# Patient Record
Sex: Male | Born: 2000 | Race: White | Hispanic: No | Marital: Single | Smoking: Never smoker
Health system: Southern US, Community
[De-identification: ages and names within clinical notes are randomized; demographics above are authoritative.]

## PROBLEM LIST (undated history)

## (undated) DIAGNOSIS — Z789 Other specified health status: Secondary | ICD-10-CM

## (undated) DIAGNOSIS — S62109A Fracture of unspecified carpal bone, unspecified wrist, initial encounter for closed fracture: Secondary | ICD-10-CM

## (undated) DIAGNOSIS — E119 Type 2 diabetes mellitus without complications: Secondary | ICD-10-CM

## (undated) HISTORY — PX: OTHER SURGICAL HISTORY: SHX169

## (undated) HISTORY — DX: Type 2 diabetes mellitus without complications: E11.9

---
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
## 2019-06-30 ENCOUNTER — Other Ambulatory Visit: Payer: Self-pay

## 2019-06-30 ENCOUNTER — Encounter: Payer: Self-pay | Admitting: Emergency Medicine

## 2019-06-30 ENCOUNTER — Ambulatory Visit
Admission: EM | Admit: 2019-06-30 | Discharge: 2019-06-30 | Disposition: A | Discharge status: Home / Self Care | Payer: 59 | Attending: Physician Assistant | Admitting: Physician Assistant

## 2019-06-30 DIAGNOSIS — L03113 Cellulitis of right upper limb: Secondary | ICD-10-CM | POA: Diagnosis not present

## 2019-06-30 DIAGNOSIS — B9689 Other specified bacterial agents as the cause of diseases classified elsewhere: Secondary | ICD-10-CM | POA: Diagnosis not present

## 2019-06-30 DIAGNOSIS — E109 Type 1 diabetes mellitus without complications: Secondary | ICD-10-CM | POA: Diagnosis not present

## 2019-06-30 MED ORDER — DOXYCYCLINE HYCLATE 100 MG PO CAPS: 100.0000 mg | ORAL_CAPSULE | Freq: Two times a day (BID) | ORAL | 0 refills | Status: DC

## 2019-06-30 NOTE — ED Provider Notes (Signed)
[facility]    CSN: [id number] Arrival date & time: 06/30/19  1923      History   Chief Complaint Chief Complaint  Patient presents with  . Cellulitis    HPI [name] [name]. is a 18 y.o. male.   18 year old male comes in for 3 day history of insect bite. He denies any itching. Has mild pain like a "pimple". Denies fever, chills, body aches. Does not recall anything biting him. He is not worried for tick bite as he has had many in the past and thinks he would have noticed it. He is type 1 diabetic and has well controlled glucose. Has not tried anything for the symptoms.      Past Medical History:  Diagnosis Date  . Diabetes mellitus without complication ([place])   . Fracture of wrist 2006   left  . Medical history non-contributory     Patient Active Problem List   Diagnosis Date Noted  . Hypoglycemia associated with diabetes ([place]) 02/10/2014  . Parent coping with child illness or disability 02/10/2014  . Euthyroid sick syndrome 01/28/2014  . Unintentional weight loss 01/28/2014  . Adjustment reaction 01/28/2014  . Hyperglycemia 01/25/2014  . New onset type 1 diabetes mellitus, uncontrolled ([place]) 01/25/2014    Past Surgical History:  Procedure Laterality Date  . ADENOIDECTOMY  2003  . myringotomy Bilateral 18 years of age       Home Medications    Prior to Admission medications   Medication Sig Start Date End Date Taking? Authorizing Provider  ACCU-CHEK FASTCLIX LANCETS MISC 1 each by Does not apply route 6 (six) times daily. Check sugar 6 x daily 01/28/14   [place], Katherine, MD  acetone, urine, test strip Check ketones per protocol 01/28/14   [place], Katherine, MD  doxycycline (VIBRAMYCIN) 100 MG capsule Take 1 capsule (100 mg total) by mouth 2 (two) times daily. 06/30/19   Tasia Catchings, Cortney Beissel V, PA-C  glucagon 1 MG injection Use for Severe Hypoglycemia . Inject 1 mg intramuscularly if unresponsive, unable to swallow, unconscious and/or has seizure 01/28/14    [place], Katherine, MD  glucose blood [facility] VERIO) test strip Check blood sugar 10 x daily 02/13/14   Lelon Huh, MD  glucose blood test strip Test sugar 6 times daily and per protocol for hyperglycemia and hypoglycemia 06/21/14   Lelon Huh, MD  ibuprofen (ADVIL,MOTRIN) 600 MG tablet Take 1 tablet (600 mg total) by mouth every 6 (six) hours as needed for mild pain or moderate pain. 12/10/16   Waynetta Pean, PA-C  insulin aspart (NOVOLOG) 100 UNIT/ML FlexPen Inject 0-50 Units into the skin 4 (four) times daily - after meals and at bedtime. Please use per sliding scale meal and bedtime plans 01/28/14   [place], Katherine, MD  Insulin Glargine (LANTUS) 100 UNIT/ML Solostar Pen Inject 0-50 Units into the skin daily at 10 pm. 01/28/14   [place], Katherine, MD  Insulin Pen Needle (INSUPEN PEN NEEDLES) 32G X 4 MM MISC BD Pen Needles- brand specific. Inject insulin via insulin pen 6 x daily 01/28/14   [place], Katherine, MD    Family History Family History  Problem Relation Age of Onset  . Diabetes Maternal Grandmother   . GER disease Father   . Diabetes Paternal Grandfather   . Depression Paternal Grandfather   . Alcoholism Paternal Grandfather   . Diabetes Paternal Aunt     Social History Social History   Tobacco Use  . Smoking status: Never Smoker  . Smokeless  tobacco: Never Used  Substance Use Topics  . Alcohol use: Not Currently  . Drug use: Never     Allergies   Patient has no known allergies.   Review of Systems Review of Systems  Reason unable to perform ROS: See HPI as above.     Physical Exam Triage Vital Signs ED Triage Vitals  Enc Vitals Group     BP 06/30/19 1932 122/73     Pulse Rate 06/30/19 1932 82     Resp 06/30/19 1932 18     Temp 06/30/19 1932 98.2 F (36.8 C)     Temp Source 06/30/19 1932 Oral     SpO2 06/30/19 1932 97 %     Weight --      Height --      Head Circumference --      Peak Flow --      Pain Score 06/30/19 1931 1     Pain Loc  --      Pain Edu? --      Excl. in GC? --    No data found.  Updated Vital Signs BP 122/73 (BP Location: Left Arm)   Pulse 82   Temp 98.2 F (36.8 C) (Oral)   Resp 18   SpO2 97%   Physical Exam Constitutional:      General: He is not in acute distress.    Appearance: He is well-developed. He is not diaphoretic.  HENT:     Head: Normocephalic and atraumatic.  Eyes:     Conjunctiva/sclera: Conjunctivae normal.     Pupils: Pupils are equal, round, and reactive to light.  Skin:    General: Skin is warm and dry.     Comments: Central wound with surrounding swelling consistent with insect bite. However, about 4cm surrounding erythema that is warm to touch. Tender to palpation. No induration, fluctuance.   Neurological:     Mental Status: He is alert and oriented to person, place, and time.     UC Treatments / Results  Labs (all labs ordered are listed, but only abnormal results are displayed) Labs Reviewed - No data to display  EKG   Radiology No results found.  Procedures Procedures (including critical care time)  Medications Ordered in UC Medications - No data to display  Initial Impression / Assessment and Plan / UC Course  I have reviewed the triage vital signs and the nursing notes.  Pertinent labs & imaging results that were available during my care of the patient were reviewed by me and considered in my medical decision making (see chart for details).    Patient does not know when surrounding erythema developed. However, diffusely tender to palpation. Given type 1 DM, will cover for cellulitis with doxycycline. Warm compress. Return precautions given. Patient expresses understanding and agrees to plan.  Final Clinical Impressions(s) / UC Diagnoses   Final diagnoses:  Cellulitis of right upper extremity    ED Prescriptions    Medication Sig Dispense Auth. Provider   doxycycline (VIBRAMYCIN) 100 MG capsule Take 1 capsule (100 mg total) by mouth 2 (two)  times daily. 20 capsule Threasa AlphaYu, Neftali Thurow V, PA-C        Rambo Sarafian V, [place] JerseyPA-C 06/30/19 1947

## 2019-06-30 NOTE — ED Triage Notes (Signed)
Pt presents to [facility] for assessment of raised, reddened area x 3 days on his right forearm.

## 2019-06-30 NOTE — ED Notes (Signed)
Patient able to ambulate independently  

## 2019-06-30 NOTE — Discharge Instructions (Signed)
Start doxycycline as directed. Warm compress to the area. Monitor for spreading redness, increased warmth, fever, follow up for reevaluation needed.

## 2019-11-17 LAB — HM DIABETES EYE EXAM

## 2020-02-19 LAB — HEMOGLOBIN A1C: Hemoglobin A1C: 8.1

## 2020-05-08 ENCOUNTER — Telehealth: Payer: Self-pay

## 2020-05-08 NOTE — Telephone Encounter (Signed)
   Are you willing to see [name] [name]  as a new patient? His grandfather Blima Dessert is a patient.  Please advise

## 2020-05-25 ENCOUNTER — Other Ambulatory Visit: Payer: Self-pay

## 2020-05-25 ENCOUNTER — Encounter: Payer: Self-pay | Admitting: Internal Medicine

## 2020-05-25 ENCOUNTER — Ambulatory Visit: Payer: 59 | Admitting: Internal Medicine

## 2020-05-25 VITALS — BP 132/70 | HR 73 | Temp 98.3°F | Ht 73.3 in | Wt 163.0 lb

## 2020-05-25 DIAGNOSIS — E1059 Type 1 diabetes mellitus with other circulatory complications: Secondary | ICD-10-CM

## 2020-05-25 DIAGNOSIS — E0781 Sick-euthyroid syndrome: Secondary | ICD-10-CM

## 2020-05-25 DIAGNOSIS — Z0000 Encounter for general adult medical examination without abnormal findings: Secondary | ICD-10-CM

## 2020-05-25 DIAGNOSIS — E10620 Type 1 diabetes mellitus with diabetic dermatitis: Secondary | ICD-10-CM | POA: Diagnosis not present

## 2020-05-25 DIAGNOSIS — I739 Peripheral vascular disease, unspecified: Secondary | ICD-10-CM

## 2020-05-25 DIAGNOSIS — E109 Type 1 diabetes mellitus without complications: Secondary | ICD-10-CM | POA: Insufficient documentation

## 2020-05-25 LAB — HEPATIC FUNCTION PANEL
ALT: 20 U/L (ref 0–53)
AST: 19 U/L (ref 0–37)
Albumin: 4.3 g/dL (ref 3.5–5.2)
Alkaline Phosphatase: 53 U/L (ref 52–171)
Bilirubin, Direct: 0.2 mg/dL (ref 0.0–0.3)
Total Bilirubin: 0.7 mg/dL (ref 0.2–1.2)
Total Protein: 6 g/dL (ref 6.0–8.3)

## 2020-05-25 LAB — LIPID PANEL
Cholesterol: 95 mg/dL (ref 0–200)
HDL: 39.6 mg/dL (ref >39.00)
LDL Cholesterol: 46 mg/dL (ref 0–99)
NonHDL: 55.86
Total CHOL/HDL Ratio: 2
Triglycerides: 47 mg/dL (ref 0.0–149.0)
VLDL: 9.4 mg/dL (ref 0.0–40.0)

## 2020-05-25 LAB — MICROALBUMIN / CREATININE URINE RATIO
Creatinine,U: 123.4 mg/dL
Microalb Creat Ratio: 1.4 mg/g (ref 0.0–30.0)
Microalb, Ur: 1.7 mg/dL (ref 0.0–1.9)

## 2020-05-25 LAB — URINALYSIS, ROUTINE W REFLEX MICROSCOPIC
Bilirubin Urine: NEGATIVE
Hgb urine dipstick: NEGATIVE
Ketones, ur: NEGATIVE
Leukocytes,Ua: NEGATIVE
Nitrite: NEGATIVE
Specific Gravity, Urine: 1.02 (ref 1.000–1.030)
Total Protein, Urine: NEGATIVE
Urine Glucose: >=1000 — AB
Urobilinogen, UA: 1 (ref 0.0–1.0)
pH: 7 (ref 5.0–8.0)

## 2020-05-25 LAB — CBC WITH DIFFERENTIAL/PLATELET
Basophils Absolute: 0 10*3/uL (ref 0.0–0.1)
Basophils Relative: 0.5 % (ref 0.0–3.0)
Eosinophils Absolute: 0.1 10*3/uL (ref 0.0–0.7)
Eosinophils Relative: 1 % (ref 0.0–5.0)
HCT: 41.1 % (ref 36.0–49.0)
Hemoglobin: 14.6 g/dL (ref 12.0–16.0)
Lymphocytes Relative: 31.5 % (ref 24.0–48.0)
Lymphs Abs: 1.7 10*3/uL (ref 0.7–4.0)
MCHC: 35.5 g/dL (ref 31.0–37.0)
MCV: 87.5 fl (ref 78.0–98.0)
Monocytes Absolute: 0.4 10*3/uL (ref 0.1–1.0)
Monocytes Relative: 7.6 % (ref 3.0–12.0)
Neutro Abs: 3.2 10*3/uL (ref 1.4–7.7)
Neutrophils Relative %: 59.4 % (ref 43.0–71.0)
Platelets: 220 10*3/uL (ref 150.0–575.0)
RBC: 4.69 Mil/uL (ref 3.80–5.70)
RDW: 13 % (ref 11.4–15.5)
WBC: 5.4 10*3/uL (ref 4.5–13.5)

## 2020-05-25 LAB — BASIC METABOLIC PANEL
BUN: 11 mg/dL (ref 6–23)
CO2: 31 mEq/L (ref 19–32)
Calcium: 8.7 mg/dL (ref 8.4–10.5)
Chloride: 102 mEq/L (ref 96–112)
Creatinine, Ser: 1 mg/dL (ref 0.40–1.50)
GFR: 95.73 mL/min (ref >60.00)
Glucose, Bld: 209 mg/dL — ABNORMAL HIGH (ref 70–99)
Potassium: 4.1 mEq/L (ref 3.5–5.1)
Sodium: 136 mEq/L (ref 135–145)

## 2020-05-25 LAB — POCT GLYCOSYLATED HEMOGLOBIN (HGB A1C): Hemoglobin A1C: 8.4 % — AB (ref 4.0–5.6)

## 2020-05-25 LAB — TSH: TSH: 1.35 u[IU]/mL (ref 0.40–5.00)

## 2020-05-25 NOTE — Progress Notes (Signed)
Subjective:  Patient ID: [name] [name]., male    DOB: 08-01-2001  Age: 19 y.o. MRN: [id number]  CC: Annual Exam and Diabetes  This visit occurred during the SARS-CoV-2 public health emergency.  Safety protocols were in place, including screening questions prior to the visit, additional usage of staff PPE, and extensive cleaning of exam room while observing appropriate contact time as indicated for disinfecting solutions.    HPI [name]. presents for a CPX.  He has had type 1 diabetes mellitus for about 5 years.  He is managed with a Medtronic insulin pump.  He wishes to find a new endocrinologist.  He has a rash on his lower extremities that is being treated by a dermatologist with a topical agent.  He has a history of abnormal thyroid function tests.  History [name] [name] has a past medical history of Diabetes mellitus without complication (HCC), Fracture of wrist (2006), and Medical history non-contributory.   He has a past surgical history that includes myringotomy (Bilateral, 19 years of age) and Adenoidectomy (2003).   His family history includes Alcoholism in his paternal grandfather; Depression in his paternal grandfather; Diabetes in his maternal grandmother, paternal aunt, and paternal grandfather; GER disease in his father.He reports that he has never smoked. He has never used smokeless tobacco. He reports previous alcohol use. He reports that he does not use drugs.  Outpatient Medications Prior to Visit  Medication Sig Dispense Refill  . glucose blood (CONTOUR NEXT TEST) test strip USE TO CHECK BLOOD GLUCOSE  UP TO 10 TIMES DAILY    . HUMALOG KWIKPEN 100 UNIT/ML KwikPen     . ibuprofen (ADVIL,MOTRIN) 600 MG tablet Take 1 tablet (600 mg total) by mouth every 6 (six) hours as needed for mild pain or moderate pain. 60 tablet 0  . Insulin Glargine (LANTUS) 100 UNIT/ML Solostar Pen Inject 0-50 Units into the skin daily at 10 pm. 15 mL 2  . insulin lispro (HUMALOG) 100  UNIT/ML injection Infuse via insulin pump up to 100 units    . Insulin Pen Needle (INSUPEN PEN NEEDLES) 32G X 4 MM MISC BD Pen Needles- brand specific. Inject insulin via insulin pen 6 x daily 250 each 3  . triamcinolone cream (KENALOG) 0.1 % Apply to the scaly areas on the leg.    Marland Kitchen acetone, urine, test strip Check ketones per protocol (Patient not taking: Reported on 05/25/2020) 50 each 3  . Glucagon (BAQSIMI ONE PACK) 3 MG/DOSE POWD Place into the nose.    Marland Kitchen ACCU-CHEK FASTCLIX LANCETS MISC 1 each by Does not apply route 6 (six) times daily. Check sugar 6 x daily 204 each 3  . doxycycline (VIBRAMYCIN) 100 MG capsule Take 1 capsule (100 mg total) by mouth 2 (two) times daily. 20 capsule 0  . glucagon 1 MG injection Use for Severe Hypoglycemia . Inject 1 mg intramuscularly if unresponsive, unable to swallow, unconscious and/or has seizure 2 each 3  . glucose blood (ONETOUCH VERIO) test strip Check blood sugar 10 x daily 300 each 6  . glucose blood test strip Test sugar 6 times daily and per protocol for hyperglycemia and hypoglycemia 300 each 12  . insulin aspart (NOVOLOG) 100 UNIT/ML FlexPen Inject 0-50 Units into the skin 4 (four) times daily - after meals and at bedtime. Please use per sliding scale meal and bedtime plans 15 mL 1   No facility-administered medications prior to visit.    ROS Review of Systems  Constitutional: Negative for  chills, diaphoresis, fatigue and fever.  HENT: Negative.   Eyes: Negative.   Respiratory: Negative for cough, chest tightness, shortness of breath and wheezing.   Cardiovascular: Negative for chest pain, palpitations and leg swelling.  Gastrointestinal: Negative for abdominal pain, constipation, diarrhea, nausea and vomiting.  Endocrine: Negative.  Negative for polydipsia, polyphagia and polyuria.  Genitourinary: Negative.  Negative for difficulty urinating, discharge, dysuria, scrotal swelling, testicular pain and urgency.  Musculoskeletal: Negative.   Negative for arthralgias and myalgias.  Skin: Negative.  Negative for color change and pallor.  Neurological: Negative.  Negative for dizziness, weakness and light-headedness.  Hematological: Negative for adenopathy. Does not bruise/bleed easily.  Psychiatric/Behavioral: Negative.     Objective:  BP 132/70 (BP Location: Left Arm, Patient Position: Sitting, Cuff Size: Normal)   Pulse 73   Temp 98.3 F (36.8 C) (Oral)   Ht 6' 1.3" (1.862 m)   Wt 163 lb (73.9 kg)   SpO2 97%   BMI 21.33 kg/m   Physical Exam Vitals reviewed.  Constitutional:      Appearance: Normal appearance.  HENT:     Nose: Nose normal.     Mouth/Throat:     Mouth: Mucous membranes are moist.  Eyes:     General: No scleral icterus.    Conjunctiva/sclera: Conjunctivae normal.  Cardiovascular:     Rate and Rhythm: Normal rate and regular rhythm.     Heart sounds: No murmur.  Pulmonary:     Effort: Pulmonary effort is normal.     Breath sounds: No stridor. No wheezing, rhonchi or rales.  Abdominal:     General: Abdomen is flat.     Palpations: There is no mass.     Tenderness: There is no abdominal tenderness. There is no guarding.     Hernia: No hernia is present.  Musculoskeletal:        General: Normal range of motion.     Cervical back: Neck supple.  Lymphadenopathy:     Cervical: No cervical adenopathy.  Skin:    General: Skin is warm.     Findings: Rash present.     Comments: Tan, waxy plaques scattered over BLE's.  Neurological:     General: No focal deficit present.     Mental Status: He is alert and oriented to person, place, and time. Mental status is at baseline.  Psychiatric:        Mood and Affect: Mood normal.        Behavior: Behavior normal.     Lab Results  Component Value Date   WBC 5.9 01/25/2014   HGB 12.6 01/25/2014   HCT 37.0 01/25/2014   PLT 222 01/25/2014   GLUCOSE 688 (HH) 01/25/2014   ALT 12 01/25/2014   AST 14 01/25/2014   NA 132 (L) 01/25/2014   K 3.7  01/25/2014   CL 91 (L) 01/25/2014   CREATININE 0.62 01/25/2014   BUN 11 01/25/2014   CO2 22 01/25/2014   TSH 1.487 01/25/2014   HGBA1C 8.1 02/19/2020    Assessment & Plan:   [name] [name] was seen today for annual exam and diabetes.  Diagnoses and all orders for this visit:  Euthyroid sick syndrome- His TSH is normal now and he appears clinically euthyroid. -     TSH; Future  Type 1 diabetes mellitus with other circulatory complication ([place])- His blood sugar is not quite adequately well controlled.  He will be meeting a new endocrinologist soon.  I am concerned he has PAD based on the examination  but he denies claudication.  I am concerned that he is unaware of symptoms. -     CBC with Differential/Platelet; Future -     Basic metabolic panel; Future -     Microalbumin / creatinine urine ratio; Future -     Hepatic function panel; Future -     Urinalysis, Routine w reflex microscopic; Future -     Ambulatory referral to Endocrinology -     HM Diabetes Foot Exam  Routine general medical examination at a health care facility- Exam completed, labs reviewed, he refused to get vaccinated against pneumonia and COVID-19, patient education material was given. -     Lipid panel; Future  PAD (peripheral artery disease) (HCC)- I recommended that he undergo arterial flow studies to see how severe this is. -     VAS [place] ABI WITH/WO TBI; Future   I have discontinued Ranee Gosselin. Temme Jr.'s Accu-Chek FastClix Lancets, glucagon, insulin aspart, glucose blood, glucose blood, and doxycycline. I am also having him maintain his Insulin Pen Needle, acetone (urine) test, insulin glargine, ibuprofen, triamcinolone cream, HumaLOG KwikPen, insulin lispro, Baqsimi One Pack, and Contour Next Test.  No orders of the defined types were placed in this encounter.    Follow-up: No follow-ups on file.  Sanda Linger, MD

## 2020-05-25 NOTE — Patient Instructions (Signed)

## 2020-06-27 ENCOUNTER — Other Ambulatory Visit: Payer: Self-pay

## 2020-06-27 ENCOUNTER — Ambulatory Visit (HOSPITAL_COMMUNITY)
Admission: RE | Admit: 2020-06-27 | Discharge: 2020-06-27 | Disposition: A | Discharge status: Home / Self Care | Payer: 59 | Source: Ambulatory Visit | Attending: Cardiovascular Disease | Admitting: Cardiovascular Disease

## 2020-06-27 DIAGNOSIS — I739 Peripheral vascular disease, unspecified: Secondary | ICD-10-CM | POA: Diagnosis not present

## 2020-06-28 ENCOUNTER — Encounter: Payer: Self-pay | Admitting: Internal Medicine

## 2020-06-28 ENCOUNTER — Ambulatory Visit (INDEPENDENT_AMBULATORY_CARE_PROVIDER_SITE_OTHER): Payer: 59 | Admitting: Internal Medicine

## 2020-06-28 VITALS — BP 114/80 | HR 80 | Ht 73.0 in | Wt 159.6 lb

## 2020-06-28 DIAGNOSIS — E1065 Type 1 diabetes mellitus with hyperglycemia: Secondary | ICD-10-CM

## 2020-06-28 MED ORDER — INSULIN GLARGINE 100 UNIT/ML SOLOSTAR PEN: 30.0000 [IU] | PEN_INJECTOR | Freq: Every day | SUBCUTANEOUS | 3 refills | Status: DC

## 2020-06-28 MED ORDER — INSUPEN PEN NEEDLES 32G X 4 MM MISC: 3 refills | Status: DC

## 2020-06-28 MED ORDER — HUMALOG KWIKPEN 100 UNIT/ML ~~LOC~~ SOPN: PEN_INJECTOR | SUBCUTANEOUS | 3 refills | Status: DC

## 2020-06-28 MED ORDER — BAQSIMI ONE PACK 3 MG/DOSE NA POWD: 3.0000 mg | NASAL | 0 refills | Status: DC

## 2020-06-28 MED ORDER — INSULIN PEN NEEDLE 32G X 4 MM MISC: 1.0000 | 3 refills | Status: DC

## 2020-06-28 NOTE — Patient Instructions (Signed)
-   Decrease Lantus to 30 units daily - Continue to use 1 unit of Humalog for every 12 grams of carbohydrates -Humalog correctional insulin: ADD extra units on insulin to your meal-time Humalog dose if your blood sugars are higher than 165. Use the scale below to help guide you:   Blood sugar before meal Number of units to inject  Less than 165 0 unit  166 -  200 1 units  201 -  235 2 units  236 -  270 3 units  271 -  305 4 units  306 -  340 5 units  341 - 375 6 units  376 -  410 7 units  411 -  445 8 units     HOW TO TREAT LOW BLOOD SUGARS (Blood sugar LESS THAN 70 MG/DL)  Please follow the RULE OF 15 for the treatment of hypoglycemia treatment (when your (blood sugars are less than 70 mg/dL)    STEP 1: Take 15 grams of carbohydrates when your blood sugar is low, which includes:   3-4 GLUCOSE TABS  OR  3-4 OZ OF JUICE OR REGULAR SODA OR  ONE TUBE OF GLUCOSE GEL     STEP 2: RECHECK blood sugar in 15 MINUTES STEP 3: If your blood sugar is still low at the 15 minute recheck --> then, go back to STEP 1 and treat AGAIN with another 15 grams of carbohydrates.

## 2020-06-28 NOTE — Progress Notes (Deleted)
Name: [name] [name].  MRN/ DOB: [id number], 05-Feb-2001   Age/ Sex: 19 y.o., male    PCP: Etta Grandchild, MD   Reason for Endocrinology Evaluation: Type 1 Diabetes Mellitus     Date of Initial Endocrinology Visit: 06/28/2020     PATIENT IDENTIFIER: [name] [name]. is a 19 y.o. male with a past medical history of T1DM. The patient presented for initial endocrinology clinic visit on 06/28/2020 for consultative assistance with his diabetes management.    HPI: [name] [name] was    Diagnosed with DM 2015 Prior Medications tried/Intolerance: used to be on Animas pump.  Currently checking blood sugars *** x / day,  before breakfast and ***.  Hypoglycemia episodes : ***               Symptoms: ***                 Frequency: ***/  Hemoglobin A1c has ranged from 8.1% in 2021, peaking at 8.4 in 2020. Patient required assistance for hypoglycemia:  Patient has required hospitalization within the last 1 year from hyper or hypoglycemia: no  In terms of diet, the patient ***  He has been seen by [facility] endo with the last visit 01/2020 . Basal rate has been reduced to 1.3 u/hr  Had issues with dexcom G5   HOME DIABETES REGIMEN: Humalog  Statin: {Yes/No:11203} ACE-I/ARB: {YES/NO:17245} Prior Diabetic Education: yes   METER DOWNLOAD SUMMARY: Date range evaluated: *** Fingerstick Blood Glucose Tests = *** Average Number Tests/Day = *** Overall Mean FS Glucose = *** Standard Deviation = ***  BG Ranges: Low = *** High = ***   Hypoglycemic Events/30 Days: BG < 50 = *** Episodes of symptomatic severe hypoglycemia = ***   DIABETIC COMPLICATIONS: Microvascular complications:   ***  Denies: ***  Last eye exam: Completed 10/2019  Macrovascular complications:   ***  Denies: CAD, PVD, CVA   PAST HISTORY: Past Medical History:  Past Medical History:  Diagnosis Date  . Diabetes mellitus without complication (HCC)   . Fracture of wrist 2006   left  . Medical history  non-contributory     Past Surgical History:  Past Surgical History:  Procedure Laterality Date  . ADENOIDECTOMY  2003  . myringotomy Bilateral 19 years of age      Social History:  reports that he has never smoked. He has never used smokeless tobacco. He reports previous alcohol use. He reports that he does not use drugs. Family History:  Family History  Problem Relation Age of Onset  . Diabetes Maternal Grandmother   . GER disease Father   . Diabetes Paternal Grandfather   . Depression Paternal Grandfather   . Alcoholism Paternal Grandfather   . Diabetes Paternal Aunt       HOME MEDICATIONS: Allergies as of 06/28/2020   No Known Allergies     Medication List       Accurate as of June 28, 2020  7:24 AM. If you have any questions, ask your nurse or doctor.        Baqsimi One Pack 3 MG/DOSE Powd Generic drug: Glucagon Place into the nose.   Contour Next Test test strip Generic drug: glucose blood USE TO CHECK BLOOD GLUCOSE  UP TO 10 TIMES DAILY   insulin lispro 100 UNIT/ML injection Commonly known as: HUMALOG Infuse via insulin pump up to 100 units   HumaLOG KwikPen 100 UNIT/ML KwikPen Generic drug: insulin lispro   insulin glargine 100  UNIT/ML Solostar Pen Commonly known as: LANTUS Inject 0-50 Units into the skin daily at 10 pm.   Insulin Pen Needle 32G X 4 MM Misc Commonly known as: Insupen Pen Needles BD Pen Needles- brand specific. Inject insulin via insulin pen 6 x daily   triamcinolone cream 0.1 % Commonly known as: KENALOG Apply to the scaly areas on the leg.        ALLERGIES: No Known Allergies   REVIEW OF SYSTEMS: A comprehensive ROS was conducted with the patient and is negative except as per HPI and below:  ROS    OBJECTIVE:   VITAL SIGNS: There were no vitals taken for this visit.   PHYSICAL EXAM:  General: Pt appears well and is in NAD  Hydration: Well-hydrated with moist mucous membranes and good skin turgor  HEENT: Head:  Unremarkable with good dentition. Oropharynx clear without exudate.  Eyes: External eye exam normal without stare, lid lag or exophthalmos.  EOM intact.  PERRL.  Neck: General: Supple without adenopathy or carotid bruits. Thyroid: Thyroid size normal.  No goiter or nodules appreciated. No thyroid bruit.  Lungs: Clear with good BS bilat with no rales, rhonchi, or wheezes  Heart: RRR with normal S1 and S2 and no gallops; no murmurs; no rub  Abdomen: Normoactive bowel sounds, soft, nontender, without masses or organomegaly palpable  Extremities:  Lower extremities - No pretibial edema. No lesions.  Skin: Normal texture and temperature to palpation. No rash noted. No Acanthosis nigricans/skin tags. No lipohypertrophy.  Neuro: MS is good with appropriate affect, pt is alert and Ox3    DM foot exam:    DATA REVIEWED:  Lab Results  Component Value Date   HGBA1C 8.4 (A) 05/25/2020   HGBA1C 8.1 02/19/2020   HGBA1C 6.6 06/21/2014   Lab Results  Component Value Date   MICROALBUR 1.7 05/25/2020   LDLCALC 46 05/25/2020   CREATININE 1.00 05/25/2020   Lab Results  Component Value Date   MICRALBCREAT 1.4 05/25/2020    Lab Results  Component Value Date   CHOL 95 05/25/2020   HDL 39.60 05/25/2020   LDLCALC 46 05/25/2020   TRIG 47.0 05/25/2020   CHOLHDL 2 05/25/2020        ASSESSMENT / PLAN / RECOMMENDATIONS:   1) Type 1 Diabetes Mellitus, ***controlled, Without complications - Most recent A1c of *** %. Goal A1c < 7.0 %.    Plan: GENERAL:  ***    - He is NOT interested in CGM  MEDICATIONS:  ***  EDUCATION / INSTRUCTIONS:  BG monitoring instructions: Patient is instructed to check his blood sugars *** times a day, ***.  Call [facility] if: BG persistently < 70 or > 300. . I reviewed the Rule of 15 for the treatment of hypoglycemia in detail with the patient. Literature supplied.   2) Diabetic complications:   Eye: Does *** have known diabetic  retinopathy.   Neuro/ Feet: Does *** have known diabetic peripheral neuropathy.  Renal: Patient does *** have known baseline CKD. He is *** on an ACEI/ARB at present.Check urine albumin/creatinine ratio yearly starting at time of diagnosis. If albuminuria is positive, treatment is geared toward better glucose, blood pressure control and use of ACE inhibitors or ARBs. Monitor electrolytes and creatinine once to twice yearly.   3) Lipids: Patient is *** on a statin.    4) Hypertension: ***  at goal of < 140/90 mmHg.       Signed electronically by: Lyndle Herrlich, MD  [facility]  [facility] [address] [place], [place] [address] Phone: [phone] FAX: [phone]   CC: Etta Grandchild, MD [address] [place] [address] Phone: [phone]  Fax: [phone]    Return to Endocrinology clinic as below: Future Appointments  Date Time Provider Department Center  06/28/2020  8:30 AM Bennette Hasty, Konrad Dolores, MD [facility]

## 2020-06-28 NOTE — Progress Notes (Signed)
Name: [name] [name].  MRN/ DOB: [id number], [birth year]   Age/ Sex: 19 y.o., male    PCP: Janith Lima, MD   Reason for Endocrinology Evaluation: Type 1 Diabetes Mellitus     Date of Initial Endocrinology Visit: 06/28/2020     PATIENT IDENTIFIER: [name] [name]. is a 19 y.o. male with a past medical history of T1DM. The patient presented for initial endocrinology clinic visit on 06/28/2020 for consultative assistance with his diabetes management.    HPI: Mr. Cozby  Is accompanied by his mother    Diagnosed with T1DM in 2015 Prior Medications tried/Intolerance: Was on the animas in the past, currently uses Medtronic 670 G during the seasons of fall, winter and spring , does not use it during the summer due to excessive sweating. Used to be on Dexcom G5 but did not have a good experience with it, has the guardian but does not use it.   Currently checking blood sugars 3x a day  Hypoglycemia episodes : Yes            Symptoms:yes              Frequency: 1/week Hemoglobin A1c has ranged from 6.6% in 2015, peaking at 8.4%  in . Patient required assistance for hypoglycemia:  Patient has required hospitalization within the last 1 year from hyper or hypoglycemia:   In terms of diet, the patient eats 3 meals a day and snacks. Has unpredictable pattern of exercise.    HOME DIABETES REGIMEN: Lantus 33 units daily  Humalog 1:12   Statin: no ACE-I/ARB: no   METER DOWNLOAD SUMMARY: Pt uses his insulin pump to check glucose    DIABETIC COMPLICATIONS: Microvascular complications:   Denies: retinopathy, neuropathy, CKD  Last eye exam: Completed 10/2019  Macrovascular complications:    Denies: CAD, PVD, CVA   PAST HISTORY: Past Medical History:  Past Medical History:  Diagnosis Date   Diabetes mellitus without complication ([place])    Fracture of wrist 2006   left   Medical history non-contributory     Past Surgical History:  Past Surgical History:    Procedure Laterality Date   ADENOIDECTOMY  2003   myringotomy Bilateral 19 years of age      Social History:  reports that he has never smoked. He has never used smokeless tobacco. He reports previous alcohol use. He reports that he does not use drugs. Family History:  Family History  Problem Relation Age of Onset   Diabetes Maternal Grandmother    GER disease Father    Diabetes Paternal Grandfather    Depression Paternal Grandfather    Alcoholism Paternal Grandfather    Diabetes Paternal Aunt       HOME MEDICATIONS: Allergies as of 06/28/2020   No Known Allergies     Medication List       Accurate as of June 28, 2020 11:02 AM. If you have any questions, ask your nurse or doctor.        Baqsimi One Pack 3 MG/DOSE Powd Generic drug: Glucagon Place 3 mg into the nose as directed. What changed:   how much to take  when to take this Changed by: Dorita Sciara, MD   Contour Next Link w/Device Kit by Does not apply route.   Contour Next Test test strip Generic drug: glucose blood USE TO CHECK BLOOD GLUCOSE  UP TO 10 TIMES DAILY   insulin glargine 100 UNIT/ML Solostar Pen Commonly known as: LANTUS Inject  30 Units into the skin daily at 10 pm. What changed: how much to take Changed by: Dorita Sciara, MD   insulin lispro 100 UNIT/ML injection Commonly known as: HUMALOG 40 Units. 1-40 units daily What changed: Another medication with the same name was changed. Make sure you understand how and when to take each. Changed by: Dorita Sciara, MD   HumaLOG KwikPen 100 UNIT/ML KwikPen Generic drug: insulin lispro Max daily 30 units What changed: additional instructions Changed by: Dorita Sciara, MD   Insulin Pen Needle 32G X 4 MM Misc 1 Device by Does not apply route as directed. Up to 6x daily What changed: You were already taking a medication with the same name, and this prescription was added. Make sure you understand how and  when to take each. Changed by: Dorita Sciara, MD   Insupen Pen Needles 32G X 4 MM Misc Generic drug: Insulin Pen Needle BD Pen Needles- brand specific. Inject insulin via insulin pen 6 x daily What changed: Another medication with the same name was added. Make sure you understand how and when to take each. Changed by: Dorita Sciara, MD   triamcinolone cream 0.1 % Commonly known as: KENALOG Apply to the scaly areas on the leg.        ALLERGIES: No Known Allergies   REVIEW OF SYSTEMS: A comprehensive ROS was conducted with the patient and is negative except as per HPI    OBJECTIVE:   VITAL SIGNS: BP 114/80 (BP Location: Left Arm, Patient Position: Sitting, Cuff Size: Normal)    Pulse 80    Ht '6\' 1"'$  (1.854 m)    Wt 159 lb 9.6 oz (72.4 kg)    SpO2 98%    BMI 21.06 kg/m    PHYSICAL EXAM:  General: Pt appears well and is in NAD  HEENT:  Eyes: External eye exam normal without stare, lid lag or exophthalmos.  EOM intact  Neck: General: Supple without adenopathy or carotid bruits. Thyroid: Thyroid size normal.  No goiter or nodules appreciated. No thyroid bruit.  Lungs: Clear with good BS bilat with no rales, rhonchi, or wheezes  Heart: RRR with normal S1 and S2 and no gallops; no murmurs; no rub  Abdomen: Normoactive bowel sounds, soft, nontender, without masses or organomegaly palpable  Extremities:  Lower extremities - No pretibial edema. No lesions.  Skin: Normal texture and temperature to palpation.  Neuro: MS is good with appropriate affect, pt is alert and Ox3    DM foot exam: 06/28/2020  The skin of the feet is intact without sores or ulcerations. The pedal pulses are 2+ on right and 2+ on left. The sensation is intact to a screening 5.07, 10 gram monofilament bilaterally   DATA REVIEWED:  Lab Results  Component Value Date   HGBA1C 8.4 (A) 05/25/2020   HGBA1C 8.1 02/19/2020   HGBA1C 6.6 06/21/2014   Lab Results  Component Value Date    MICROALBUR 1.7 05/25/2020   LDLCALC 46 05/25/2020   CREATININE 1.00 05/25/2020   Lab Results  Component Value Date   MICRALBCREAT 1.4 05/25/2020    Lab Results  Component Value Date   CHOL 95 05/25/2020   HDL 39.60 05/25/2020   LDLCALC 46 05/25/2020   TRIG 47.0 05/25/2020   CHOLHDL 2 05/25/2020        ASSESSMENT / PLAN / RECOMMENDATIONS:   1) Type 1 Diabetes Mellitus, Sub-Optimally controlled, Without complications - Most recent A1c of  8.4%. Goal A1c <  7.0 %.    - I have discussed with the patient the pathophysiology of diabetes. We stressed the importance of lifestyle changes including diet and exercise. I explained the complications associated with diabetes including retinopathy, nephropathy, neuropathy as well as increased risk of cardiovascular disease.   - He mainly uses the insulin pump except in the summers, so currently on Lantus/Humalog  - Pt does not count Carbs and end up guessing on his prandial dose which causes fluctuating BG from 55-300 mg.dL. He also tends to take it after the meal rather than before  -Discussed pharmacokinetics of basal/bolus insulin and the importance of taking prandial insulin with meals.  We also discussed avoiding sugar-sweetened beverages and snacks, when possible.  - Pt instructed to check BG before exercise, if < 150 mg/dL needs to eat a protein bar.  - Pt did not have a good experience with dexcom but also does not use the guardian  - He is due for an update, advised pt to notify us if he is interested in upgrading his pump   MEDICATIONS: - Decrease Lantus to 30 units daily - Continue to use 1 unit of Humalog for every 12 grams of carbohydrates - CF : Humalog ( BG-130/35)  EDUCATION / INSTRUCTIONS:  BG monitoring instructions: Patient is instructed to check his blood sugars 4 times a day, before meals and bedtime .  Call [facility] if: BG persistently < 70 or > 300.  I reviewed the Rule of 15 for the treatment  of hypoglycemia in detail with the patient. Literature supplied.   2) Diabetic complications:   Eye: Does not have known diabetic retinopathy.   Neuro/ Feet: Does not have known diabetic peripheral neuropathy.  Renal: Patient does not have known baseline CKD. He is noton an ACEI/ARB at present.Urine albumin/creatinine ratio is normal.      F/U in 3 months     I spent 45 minutes preparing to see the patient by review of recent labs, imaging and procedures, obtaining and reviewing separately obtained history, communicating with the patient/family or caregiver, ordering medications, tests or procedures, and documenting clinical information in the EHR including the differential Dx, treatment, and any further evaluation and other management   Signed electronically by: Mack Guise, MD  [facility]  [facility] [address] [place], [place] [address] Phone: [phone] FAX: [phone]   CC: Janith Lima, [place] [address] Phone: [phone]  Fax: [phone]    Return to Endocrinology clinic as below: Future Appointments  Date Time Provider [place]  10/03/2020  7:30 AM Bryce Kimble, Melanie Crazier, MD [facility]

## 2020-10-03 ENCOUNTER — Encounter: Payer: Self-pay | Admitting: Internal Medicine

## 2020-10-03 ENCOUNTER — Ambulatory Visit (INDEPENDENT_AMBULATORY_CARE_PROVIDER_SITE_OTHER): Payer: 59 | Admitting: Internal Medicine

## 2020-10-03 ENCOUNTER — Other Ambulatory Visit: Payer: Self-pay

## 2020-10-03 VITALS — BP 118/80 | HR 85 | Ht 73.0 in | Wt 159.0 lb

## 2020-10-03 DIAGNOSIS — E1065 Type 1 diabetes mellitus with hyperglycemia: Secondary | ICD-10-CM | POA: Diagnosis not present

## 2020-10-03 LAB — POCT GLYCOSYLATED HEMOGLOBIN (HGB A1C): Hemoglobin A1C: 8.2 % — AB (ref 4.0–5.6)

## 2020-10-03 MED ORDER — INSUPEN PEN NEEDLES 32G X 4 MM MISC
1.0000 | Freq: Every day | 3 refills | Status: DC
Start: 2020-10-03 — End: 2021-05-25

## 2020-10-03 NOTE — Patient Instructions (Signed)
-   Check out the INPEN Humalog     - Continue Lantus 30 units daily - Continue to use 1 unit of Humalog for every 12 grams of carbohydrates -Humalog correctional insulin: ADD extra units on insulin to your meal-time Humalog dose if your blood sugars are higher than 165. Use the scale below to help guide you:   Blood sugar before meal Number of units to inject  Less than 165 0 unit  166 -  200 1 units  201 -  235 2 units  236 -  270 3 units  271 -  305 4 units  306 -  340 5 units  341 - 375 6 units  376 -  410 7 units  411 -  445 8 units     HOW TO TREAT LOW BLOOD SUGARS (Blood sugar LESS THAN 70 MG/DL)  Please follow the RULE OF 15 for the treatment of hypoglycemia treatment (when your (blood sugars are less than 70 mg/dL)    STEP 1: Take 15 grams of carbohydrates when your blood sugar is low, which includes:   3-4 GLUCOSE TABS  OR  3-4 OZ OF JUICE OR REGULAR SODA OR  ONE TUBE OF GLUCOSE GEL     STEP 2: RECHECK blood sugar in 15 MINUTES STEP 3: If your blood sugar is still low at the 15 minute recheck --> then, go back to STEP 1 and treat AGAIN with another 15 grams of carbohydrates.

## 2020-10-03 NOTE — Progress Notes (Signed)
Name: [name] [name].  Age/ Sex: 19 y.o., male   MRN/ DOB: [id number], 03-15-2001     PCP: Janith Lima, MD   Reason for Endocrinology Evaluation: Type 1 Diabetes Mellitus  Initial Endocrine Consultative Visit: 06/28/2020    PATIENT IDENTIFIER: [name] [name]. is a 19 y.o. male with a past medical history of T1DM. The patient has followed with Endocrinology clinic since 06/28/2020 for consultative assistance with management of his diabetes.  DIABETIC HISTORY:  [name] was diagnosed with DM in 2015.Was on the animas in the past, currently uses Medtronic 670 G during the seasons of fall, winter and spring , does not use it during the summer due to excessive sweating. Used to be on Dexcom G5 but did not have a good experience with it, has the guardian but does not use it.  His hemoglobin A1c has ranged from 6.6% in 2015, peaking at 8.4%  in .  He studies advance Camera operator  SUBJECTIVE:   During the last visit (06/28/2020): A1c 8.4% we adjusted MDI regimen and provided him with  A correction scale   Today (10/03/2020): [name] is here for a follow up on diabetes management. He is accompanied by his mother.  He checks his blood sugars 2 times daily The patient has had hypoglycemic episodes since the last clinic visit, which typically occur  Fasting . The patient is symptomatic with these episodes.     HOME DIABETES REGIMEN:  Lantus 30 units daily  Humalog 1:12 - not using  CF: Humalog ( BG -130/35)     METER DOWNLOAD SUMMARY: Unable to download 71- 556 mg/dL     DIABETIC COMPLICATIONS: Microvascular complications:    Denies: CKD, neuropathy, retinopathy  Last Eye Exam: Completed 10/2019  Macrovascular complications:    Denies: CAD, CVA, PVD   HISTORY:  Past Medical History:  Past Medical History:  Diagnosis Date  . Diabetes mellitus without complication ([place])   . Fracture of wrist 2006   left  . Medical history non-contributory    Past  Surgical History:  Past Surgical History:  Procedure Laterality Date  . ADENOIDECTOMY  2003  . myringotomy Bilateral 19 years of age    Social History:  reports that he has never smoked. He has never used smokeless tobacco. He reports previous alcohol use. He reports that he does not use drugs. Family History:  Family History  Problem Relation Age of Onset  . Diabetes Maternal Grandmother   . GER disease Father   . Diabetes Paternal Grandfather   . Depression Paternal Grandfather   . Alcoholism Paternal Grandfather   . Diabetes Paternal Aunt      HOME MEDICATIONS: Allergies as of 10/03/2020   No Known Allergies     Medication List       Accurate as of October 03, 2020  8:13 AM. If you have any questions, ask your nurse or doctor.        Baqsimi One Pack 3 MG/DOSE Powd Generic drug: Glucagon Place 3 mg into the nose as directed.   Contour Next Link w/Device Kit by Does not apply route.   Contour Next Test test strip Generic drug: glucose blood USE TO CHECK BLOOD GLUCOSE  UP TO 10 TIMES DAILY   insulin glargine 100 UNIT/ML Solostar Pen Commonly known as: LANTUS Inject 30 Units into the skin daily at 10 pm.   insulin lispro 100 UNIT/ML injection Commonly known as: HUMALOG 40 Units. 1-40 units daily  HumaLOG KwikPen 100 UNIT/ML KwikPen Generic drug: insulin lispro Max daily 30 units   Insulin Pen Needle 32G X 4 MM Misc 1 Device by Does not apply route as directed. Up to 6x daily   Insupen Pen Needles 32G X 4 MM Misc Generic drug: Insulin Pen Needle BD Pen Needles- brand specific. Inject insulin via insulin pen 6 x daily   triamcinolone cream 0.1 % Commonly known as: KENALOG Apply to the scaly areas on the leg.        OBJECTIVE:   Vital Signs: BP 118/80   Pulse 85   Ht _0  (1.854 m)   Wt 159 lb (72.1 kg)   SpO2 98%   BMI 20.98 kg/m   Wt Readings from Last 3 Encounters:  10/03/20 159 lb (72.1 kg) (56 %, Z= 0.14)*  06/28/20 159 lb 9.6 oz (72.4  kg) (58 %, Z= 0.20)*  05/25/20 163 lb (73.9 kg) (63 %, Z= 0.34)*   * Growth percentiles are based on CDC (Boys, 2-20 Years) data.     Exam: General: Pt appears well and is in NAD  Neck: General: Supple without adenopathy. Thyroid: Thyroid size normal.  No goiter or nodules appreciated. No thyroid bruit.  Lungs: Clear with good BS bilat with no rales, rhonchi, or wheezes  Heart: RRR with normal S1 and S2 and no gallops; no murmurs; no rub  Abdomen: Normoactive bowel sounds, soft, nontender, without masses or organomegaly palpable  Extremities: No pretibial edema.   Neuro: MS is good with appropriate affect, pt is alert and Ox3    DM foot exam: 06/28/2020  The skin of the feet is intact without sores or ulcerations. The pedal pulses are 2+ on right and 2+ on left. The sensation is intact to a screening 5.07, 10 gram monofilament bilaterally    DATA REVIEWED:  Lab Results  Component Value Date   HGBA1C 8.2 (A) 10/03/2020   HGBA1C 8.4 (A) 05/25/2020   HGBA1C 8.1 02/19/2020   Lab Results  Component Value Date   MICROALBUR 1.7 05/25/2020   LDLCALC 46 05/25/2020   CREATININE 1.00 05/25/2020   Lab Results  Component Value Date   MICRALBCREAT 1.4 05/25/2020     Lab Results  Component Value Date   CHOL 95 05/25/2020   HDL 39.60 05/25/2020   LDLCALC 46 05/25/2020   TRIG 47.0 05/25/2020   CHOLHDL 2 05/25/2020         ASSESSMENT / PLAN / RECOMMENDATIONS:   1) Type 1 Diabetes Mellitus, Sub-optimally controlled, Without complications - Most recent A1c of 8.2  %. Goal A1c < 7.0 %.   - A1c stable  - Pt is requesting an increase on his basal insulin, upon further questioning he admits to fasting hypoglycemia, but has noted hyperglycemia after lunch. He is not counting carbs, but " knows how much prandial insulin based on what he eats"  - I have advised him to increase Humalog dose by 2 units with Lunch and go from there.  - I have encouraged him to count carbs, as this  is how we would advise him on any adjustments - Declines CGM's- not a favourable experience for him  - Uses insulin  Pump during cool weather months, as it falls with excessive sweating  - I have asked him to check the Inpen, this may be a good device for him , he will check in to it  - I have also advised him to decrease lantus ot 28 units with fasting hypoglycemia,  but at this time he would like to stay with 30 units    MEDICATIONS: - Continue Lantus 30 units daily - Continue to use 1 unit of Humalog for every 12 grams of carbohydrates - CF : Humalog ( BG-130/35)  EDUCATION / INSTRUCTIONS:  BG monitoring instructions: Patient is instructed to check his blood sugars 4 times a day, before meals and bedtime   Call [place] [facility] clinic if: BG persistently < 70 . I reviewed the Rule of 15 for the treatment of hypoglycemia in detail with the patient. Literature supplied.  2) Diabetic complications:   Eye: Does not   have known diabetic retinopathy.   Neuro/ Feet: Does not have known diabetic peripheral neuropathy .   Renal: Patient does not have known baseline CKD.     F/U in 4 months    Signed electronically by: Mack Guise, MD  [facility]  [facility] [address] [place], [place] [address] Phone: [phone] FAX: [phone]   CC: Janith Lima, MD [place] [address] Phone: [phone]  Fax: [phone]  Return to Endocrinology clinic as below: No future appointments.

## 2020-11-30 ENCOUNTER — Encounter: Payer: Self-pay | Admitting: Internal Medicine

## 2020-12-01 ENCOUNTER — Encounter: Payer: Self-pay | Admitting: Internal Medicine

## 2021-01-05 ENCOUNTER — Encounter: Payer: Self-pay | Admitting: Internal Medicine

## 2021-01-08 DIAGNOSIS — Z0279 Encounter for issue of other medical certificate: Secondary | ICD-10-CM

## 2021-01-08 NOTE — Telephone Encounter (Signed)
Called patient and notified Dr Harvel Ricks comments. Left in the front office for pick up

## 2021-01-08 NOTE — Telephone Encounter (Signed)
Please let the pt know the form is ready, there's $20 charge.  Please attach a copy of his last A1c   Thanks

## 2021-02-06 ENCOUNTER — Ambulatory Visit: Payer: 59 | Admitting: Internal Medicine

## 2021-03-27 ENCOUNTER — Ambulatory Visit: Payer: 59 | Admitting: Internal Medicine

## 2021-03-27 ENCOUNTER — Other Ambulatory Visit: Payer: Self-pay

## 2021-03-27 ENCOUNTER — Encounter: Payer: Self-pay | Admitting: Internal Medicine

## 2021-03-27 VITALS — BP 124/82 | HR 86 | Ht 73.0 in | Wt 164.2 lb

## 2021-03-27 DIAGNOSIS — E1065 Type 1 diabetes mellitus with hyperglycemia: Secondary | ICD-10-CM | POA: Diagnosis not present

## 2021-03-27 LAB — POCT GLYCOSYLATED HEMOGLOBIN (HGB A1C): Hemoglobin A1C: 8.8 % — AB (ref 4.0–5.6)

## 2021-03-27 LAB — POCT GLUCOSE (DEVICE FOR HOME USE): POC Glucose: 239 mg/dl — AB (ref 70–99)

## 2021-03-27 MED ORDER — BAQSIMI ONE PACK 3 MG/DOSE NA POWD: 3.0000 mg | NASAL | 1 refills | Status: AC

## 2021-03-27 MED ORDER — INSULIN GLARGINE 100 UNIT/ML SOLOSTAR PEN
32.0000 [IU] | PEN_INJECTOR | Freq: Every day | SUBCUTANEOUS | 3 refills | Status: DC
Start: 2021-03-27 — End: 2022-09-30

## 2021-03-27 MED ORDER — HUMALOG KWIKPEN 100 UNIT/ML ~~LOC~~ SOPN
PEN_INJECTOR | SUBCUTANEOUS | 3 refills | Status: DC
Start: 2021-03-27 — End: 2021-06-01

## 2021-03-27 MED ORDER — ONETOUCH VERIO VI STRP: ORAL_STRIP | 3 refills | Status: DC

## 2021-03-27 NOTE — Patient Instructions (Addendum)
-   Increase  Lantus to 32 units daily - Humalog 1 unit of Humalog for every 12 grams of carbohydrates -Humalog correctional insulin: ADD extra units on insulin to your meal-time Humalog dose if your blood sugars are higher than 165. Use the scale below to help guide you:   Blood sugar before meal Number of units to inject  Less than 165 0 unit  166 -  200 1 units  201 -  235 2 units  236 -  270 3 units  271 -  305 4 units  306 -  340 5 units  341 - 375 6 units  376 -  410 7 units  411 -  445 8 units     HOW TO TREAT LOW BLOOD SUGARS (Blood sugar LESS THAN 70 MG/DL)  Please follow the RULE OF 15 for the treatment of hypoglycemia treatment (when your (blood sugars are less than 70 mg/dL)    STEP 1: Take 15 grams of carbohydrates when your blood sugar is low, which includes:   3-4 GLUCOSE TABS  OR  3-4 OZ OF JUICE OR REGULAR SODA OR  ONE TUBE OF GLUCOSE GEL     STEP 2: RECHECK blood sugar in 15 MINUTES STEP 3: If your blood sugar is still low at the 15 minute recheck --> then, go back to STEP 1 and treat AGAIN with another 15 grams of carbohydrates.

## 2021-03-27 NOTE — Progress Notes (Signed)
Name: [name] [name].  Age/ Sex: 20 y.o., male   MRN/ DOB: [id number], June 13, 2001     PCP: Janith Lima, MD   Reason for Endocrinology Evaluation: Type 1 Diabetes Mellitus  Initial Endocrine Consultative Visit: 06/28/2020    PATIENT IDENTIFIER: [name]. is a 20 y.o. male with a past medical history of T1DM. The patient has followed with Endocrinology clinic since 06/28/2020 for consultative assistance with management of his diabetes.  DIABETIC HISTORY:  [name] was diagnosed with DM in 2015.Was on the animas in the past, currently uses Medtronic 670 G during the seasons of fall, winter and spring , does not use it during the summer due to excessive sweating. Used to be on Dexcom G5 but did not have a good experience with it, has the guardian but does not use it.  His hemoglobin A1c has ranged from 6.6% in 2015, peaking at 8.4%  in .  He studies advance Camera operator  SUBJECTIVE:   During the last visit (10/03/2020): A1c 8.2% we continued  MDI regimen   Today (03/27/2021): [name] is here for a follow up on diabetes management. He is accompanied by his mother.  He checks his blood sugars 2 times daily. The patient has had hypoglycemic episodes since the last clinic visit, which typically occur  Fast . The patient is symptomatic with these episodes.   Denies nausea or vomiting   This morning he ate pop tarts  219 this am   HOME DIABETES REGIMEN:  Lantus 30 units daily  Humalog 1:12 - not using  CF: Humalog ( BG -130/35)     METER DOWNLOAD SUMMARY: Unable to download 71- 556 mg/dL     DIABETIC COMPLICATIONS: Microvascular complications:    Denies: CKD, neuropathy, retinopathy  Last Eye Exam: Completed 10/2019  Macrovascular complications:    Denies: CAD, CVA, PVD   HISTORY:  Past Medical History:  Past Medical History:  Diagnosis Date  . Diabetes mellitus without complication ([place])   . Fracture of wrist 2006   left  . Medical  history non-contributory    Past Surgical History:  Past Surgical History:  Procedure Laterality Date  . ADENOIDECTOMY  2003  . myringotomy Bilateral 20 years of age    Social History:  reports that he has never smoked. He has never used smokeless tobacco. He reports previous alcohol use. He reports that he does not use drugs. Family History:  Family History  Problem Relation Age of Onset  . Diabetes Maternal Grandmother   . GER disease Father   . Diabetes Paternal Grandfather   . Depression Paternal Grandfather   . Alcoholism Paternal Grandfather   . Diabetes Paternal Aunt      HOME MEDICATIONS: Allergies as of 03/27/2021   No Known Allergies     Medication List       Accurate as of March 27, 2021  4:23 PM. If you have any questions, ask your nurse or doctor.        Baqsimi One Pack 3 MG/DOSE Powd Generic drug: Glucagon Place 3 mg into the nose as directed.   Contour Next Link w/Device Kit by Does not apply route.   insulin glargine 100 UNIT/ML Solostar Pen Commonly known as: LANTUS Inject 32 Units into the skin daily at 10 pm. What changed: how much to take Changed by: Dorita Sciara, MD   insulin lispro 100 UNIT/ML injection Commonly known as: HUMALOG 40 Units. 1-40 units daily What changed: Another  medication with the same name was changed. Make sure you understand how and when to take each. Changed by: Dorita Sciara, MD   HumaLOG KwikPen 100 UNIT/ML KwikPen Generic drug: insulin lispro Max daily 40 units What changed: additional instructions Changed by: Dorita Sciara, MD   Insulin Pen Needle 32G X 4 MM Misc 1 Device by Does not apply route as directed. Up to 6x daily   Insupen Pen Needles 32G X 4 MM Misc Generic drug: Insulin Pen Needle Inject 1 Device into the skin 6 (six) times daily. BD Pen Needles- brand specific. Inject insulin via insulin pen 6 x daily   OneTouch Verio test strip Generic drug: glucose blood 4x daily  before meals and bedtime What changed: See the new instructions. Changed by: Dorita Sciara, MD   triamcinolone 0.1 % Commonly known as: KENALOG Apply to the scaly areas on the leg.        OBJECTIVE:   Vital Signs: BP 124/82   Pulse 86   Ht $R'6\' 1"'mo$  (1.854 m)   Wt 164 lb 4 oz (74.5 kg)   SpO2 98%   BMI 21.67 kg/m   Wt Readings from Last 3 Encounters:  03/27/21 164 lb 4 oz (74.5 kg)  10/03/20 159 lb (72.1 kg) (56 %, Z= 0.14)*  06/28/20 159 lb 9.6 oz (72.4 kg) (58 %, Z= 0.20)*   * Growth percentiles are based on CDC (Boys, 2-20 Years) data.     Exam: General: Pt appears well and is in NAD  Neck: General: Supple without adenopathy. Thyroid: Thyroid size normal.  No goiter or nodules appreciated.  Lungs: Clear with good BS bilat   Heart: RRR   Abdomen: Normoactive bowel sounds, soft, nontender, without masses or organomegaly palpable  Extremities: No pretibial edema.   Neuro: MS is good with appropriate affect, pt is alert and Ox3    DM foot exam: 06/28/2020  The skin of the feet is intact without sores or ulcerations. The pedal pulses are 2+ on right and 2+ on left. The sensation is intact to a screening 5.07, 10 gram monofilament bilaterally    DATA REVIEWED:  Lab Results  Component Value Date   HGBA1C 8.8 (A) 03/27/2021   HGBA1C 8.2 (A) 10/03/2020   HGBA1C 8.4 (A) 05/25/2020   Lab Results  Component Value Date   MICROALBUR 1.7 05/25/2020   LDLCALC 46 05/25/2020   CREATININE 1.00 05/25/2020   Lab Results  Component Value Date   MICRALBCREAT 1.4 05/25/2020     Lab Results  Component Value Date   CHOL 95 05/25/2020   HDL 39.60 05/25/2020   LDLCALC 46 05/25/2020   TRIG 47.0 05/25/2020   CHOLHDL 2 05/25/2020         ASSESSMENT / PLAN / RECOMMENDATIONS:   1) Type 1 Diabetes Mellitus, Poorly  controlled, Without complications - Most recent A1c of 8.8  %. Goal A1c < 7.0 %.   - Pt with hyperglycemia due to dietary indiscretions and  medication non-adherence, he does not always use prandial insulin , and when he does he has not been counting carbs hence the hyper and hypoglycemia. He also does not use the correction scale.  - I have again  encouraged him to count carbs - Declines CGM's- not a favourable experience for him  - In the past he has told me he uses a pump during the cool months, but he has not used it for the past year. I do not recommend an upgrade ,  since he barely used his old one and he is not interested in any IQ technology.  - I have advised him to look in to the InPen in the past  - Unfortunately my role is limited here and all I can do is remind him of the importance of glycemic control in preventing microvascular complications   - He was provided with a one touch verio meter today   MEDICATIONS: - Increase  Lantus 32 units daily - Humalog I: C ratio of 1 unit for every 12 grams of carbohydrates - CF : Humalog ( BG-130/35)  EDUCATION / INSTRUCTIONS:  BG monitoring instructions: Patient is instructed to check his blood sugars 4 times a day, before meals and bedtime   Call [place] [facility] clinic if: BG persistently < 70 . I reviewed the Rule of 15 for the treatment of hypoglycemia in detail with the patient. Literature supplied.  2) Diabetic complications:   Eye: Does not   have known diabetic retinopathy.   Neuro/ Feet: Does not have known diabetic peripheral neuropathy .   Renal: Patient does not have known baseline CKD.     F/U in  6 months    Signed electronically by: Mack Guise, MD  [facility] Endocrinology  [facility] [address] [place], [place] [address] Phone: [phone] FAX: [phone]   CC: Janith Lima, MD [place] [address] Phone: [phone]  Fax: [phone]  Return to Endocrinology clinic as below: Future Appointments  Date Time Provider [place]  10/02/2021  7:50 AM Divon Krabill,  Melanie Crazier, MD [facility] [place]

## 2021-05-24 ENCOUNTER — Encounter: Payer: Self-pay | Admitting: Internal Medicine

## 2021-05-25 MED ORDER — INSUPEN PEN NEEDLES 32G X 4 MM MISC: 1.0000 | Freq: Every day | 0 refills | Status: DC

## 2021-06-01 ENCOUNTER — Other Ambulatory Visit: Payer: Self-pay | Admitting: Internal Medicine

## 2021-06-01 MED ORDER — HUMALOG KWIKPEN 200 UNIT/ML ~~LOC~~ SOPN: PEN_INJECTOR | SUBCUTANEOUS | 3 refills | Status: DC

## 2021-10-02 ENCOUNTER — Ambulatory Visit: Payer: 59 | Admitting: Internal Medicine

## 2021-11-27 ENCOUNTER — Other Ambulatory Visit: Payer: Self-pay

## 2021-11-27 ENCOUNTER — Ambulatory Visit: Payer: 59 | Admitting: Internal Medicine

## 2021-11-27 ENCOUNTER — Encounter: Payer: Self-pay | Admitting: Internal Medicine

## 2021-11-27 VITALS — BP 124/80 | HR 74 | Ht 73.0 in | Wt 167.0 lb

## 2021-11-27 DIAGNOSIS — E1065 Type 1 diabetes mellitus with hyperglycemia: Secondary | ICD-10-CM

## 2021-11-27 LAB — BASIC METABOLIC PANEL
BUN: 12 mg/dL (ref 6–23)
CO2: 28 mEq/L (ref 19–32)
Calcium: 9.1 mg/dL (ref 8.4–10.5)
Chloride: 103 mEq/L (ref 96–112)
Creatinine, Ser: 0.98 mg/dL (ref 0.40–1.50)
GFR: 110.6 mL/min (ref >60.00)
Glucose, Bld: 119 mg/dL — ABNORMAL HIGH (ref 70–99)
Potassium: 4.1 mEq/L (ref 3.5–5.1)
Sodium: 138 mEq/L (ref 135–145)

## 2021-11-27 LAB — LIPID PANEL
Cholesterol: 113 mg/dL (ref 0–200)
HDL: 47.2 mg/dL (ref >39.00)
LDL Cholesterol: 57 mg/dL (ref 0–99)
NonHDL: 66.19
Total CHOL/HDL Ratio: 2
Triglycerides: 44 mg/dL (ref 0.0–149.0)
VLDL: 8.8 mg/dL (ref 0.0–40.0)

## 2021-11-27 LAB — POCT GLYCOSYLATED HEMOGLOBIN (HGB A1C): Hemoglobin A1C: 8.2 % — AB (ref 4.0–5.6)

## 2021-11-27 LAB — MICROALBUMIN / CREATININE URINE RATIO
Creatinine,U: 203.8 mg/dL
Microalb Creat Ratio: 0.9 mg/g (ref 0.0–30.0)
Microalb, Ur: 1.8 mg/dL (ref 0.0–1.9)

## 2021-11-27 LAB — GLUCOSE, POCT (MANUAL RESULT ENTRY): POC Glucose: 142 mg/dl — AB (ref 70–99)

## 2021-11-27 LAB — T4, FREE: Free T4: 0.82 ng/dL (ref 0.60–1.60)

## 2021-11-27 LAB — TSH: TSH: 1.58 u[IU]/mL (ref 0.35–5.50)

## 2021-11-27 MED ORDER — CONTOUR NEXT TEST VI STRP: 1.0000 | ORAL_STRIP | Freq: Four times a day (QID) | 3 refills | Status: DC

## 2021-11-27 MED ORDER — CONTOUR NEXT LINK W/DEVICE KIT: 1.0000 | PACK | Freq: Four times a day (QID) | 0 refills | Status: AC

## 2021-11-27 NOTE — Progress Notes (Signed)
Name: Savas Elvin.  Age/ Sex: 20 y.o., male   MRN/ DOB: [id number], [birth year]     PCP: Janith Lima, MD   Reason for Endocrinology Evaluation: Type 1 Diabetes Mellitus  Initial Endocrine Consultative Visit: 06/28/2020    PATIENT IDENTIFIER: [name]. is a 20 y.o. male with a past medical history of T1DM. The patient has followed with Endocrinology clinic since 06/28/2020 for consultative assistance with management of his diabetes.  DIABETIC HISTORY:  Mr. Mcgeehan was diagnosed with DM in 2015.Was on the animas in the past, currently uses Medtronic 670 G during the seasons of fall, winter and spring , does not use it during the summer due to excessive sweating. Used to be on Dexcom G5 but did not have a good experience with it, has the guardian but does not use it.  His hemoglobin A1c has ranged from 6.6% in 2015, peaking at 8.4%  in .  He studies advance Camera operator  SUBJECTIVE:   During the last visit (03/27/2021): A1c 8.8% we continued  MDI regimen      Today (11/27/2021): Mr. Wiegman is here for a follow up on diabetes management. He is accompanied by his mother.  He checks his blood sugars 2 -3 times daily. The patient has had hypoglycemic episodes since the last clinic visit, which typically occur between breakfast and lunch  . The patient is symptomatic with these episodes, lowest in the 50's    Denies nausea or vomiting or diarrhea     HOME DIABETES REGIMEN:  Lantus 32 units daily - taking 36 units  Humalog 1:12 - 1:8  CF: Humalog ( BG -130/35)     METER DOWNLOAD SUMMARY: Unable to download  47 - 444m/dL     DIABETIC COMPLICATIONS: Microvascular complications:   Denies: CKD, neuropathy, retinopathy Last Eye Exam: Completed 10/2019  Macrovascular complications:   Denies: CAD, CVA, PVD   HISTORY:  Past Medical History:  Past Medical History:  Diagnosis Date   Diabetes mellitus without complication ([place]    Fracture of wrist  2006   left   Medical history non-contributory    Past Surgical History:  Past Surgical History:  Procedure Laterality Date   ADENOIDECTOMY  2003   myringotomy Bilateral 20years of age   Social History:  reports that he has never smoked. He has never used smokeless tobacco. He reports that he does not currently use alcohol. He reports that he does not use drugs. Family History:  Family History  Problem Relation Age of Onset   Diabetes Maternal Grandmother    GER disease Father    Diabetes Paternal Grandfather    Depression Paternal Grandfather    Alcoholism Paternal Grandfather    Diabetes Paternal Aunt      HOME MEDICATIONS: Allergies as of 11/27/2021   No Known Allergies      Medication List        Accurate as of November 27, 2021  7:46 AM. If you have any questions, ask your nurse or doctor.          STOP taking these medications    OneTouch Verio test strip Generic drug: glucose blood Stopped by: IDorita Sciara MD       TAKE these medications    Baqsimi One Pack 3 MG/DOSE Powd Generic drug: Glucagon Place 3 mg into the nose as directed.   Contour Next Link w/Device Kit by Does not apply route.   HumaLOG KwikPen 200 UNIT/ML KwikPen Generic  drug: insulin lispro Max daily 120 units   insulin glargine 100 UNIT/ML Solostar Pen Commonly known as: LANTUS Inject 32 Units into the skin daily at 10 pm.   Insupen Pen Needles 32G X 4 MM Misc Generic drug: Insulin Pen Needle Inject 1 Device into the skin 6 (six) times daily. BD Pen Needles- brand specific. Inject insulin via insulin pen 6 x daily   triamcinolone cream 0.1 % Commonly known as: KENALOG Apply to the scaly areas on the leg.         OBJECTIVE:   Vital Signs: BP 124/80 (BP Location: Left Arm, Patient Position: Sitting, Cuff Size: Small)   Pulse 74   Ht 6' 1" (1.854 m)   Wt 167 lb (75.8 kg)   SpO2 98%   BMI 22.03 kg/m   Wt Readings from Last 3 Encounters:  11/27/21 167 lb  (75.8 kg)  03/27/21 164 lb 4 oz (74.5 kg)  10/03/20 159 lb (72.1 kg) (56 %, Z= 0.14)*   * Growth percentiles are based on CDC (Boys, 2-20 Years) data.     Exam: General: Pt appears well and is in NAD  Neck: General: Supple without adenopathy. Thyroid: Thyroid size normal.  No goiter or nodules appreciated.  Lungs: Clear with good BS bilat   Heart: RRR   Abdomen: Normoactive bowel sounds, soft, nontender, without masses or organomegaly palpable  Extremities: No pretibial edema.   Neuro: MS is good with appropriate affect, pt is alert and Ox3     DM foot exam: 11/27/2021   The skin of the feet is intact without sores or ulcerations. The pedal pulses are 2+ on right and 2+ on left. The sensation is intact to a screening 5.07, 10 gram monofilament bilaterally     DATA REVIEWED:  Lab Results  Component Value Date   HGBA1C 8.8 (A) 03/27/2021   HGBA1C 8.2 (A) 10/03/2020   HGBA1C 8.4 (A) 05/25/2020     Latest Reference Range & Units 11/27/21 08:08  Sodium 135 - 145 mEq/L 138  Potassium 3.5 - 5.1 mEq/L 4.1  Chloride 96 - 112 mEq/L 103  CO2 19 - 32 mEq/L 28  Glucose 70 - 99 mg/dL 119 (H)  BUN 6 - 23 mg/dL 12  Creatinine 0.40 - 1.50 mg/dL 0.98  Calcium 8.4 - 10.5 mg/dL 9.1  GFR >60.00 mL/min 110.60  Total CHOL/HDL Ratio  2  Cholesterol 0 - 200 mg/dL 113  HDL Cholesterol >39.00 mg/dL 47.20  LDL (calc) 0 - 99 mg/dL 57  MICROALB/CREAT RATIO 0.0 - 30.0 mg/g 0.9  NonHDL  66.19  Triglycerides 0.0 - 149.0 mg/dL 44.0  VLDL 0.0 - 40.0 mg/dL 8.8  TSH 0.35 - 5.50 uIU/mL 1.58  T4,Free(Direct) 0.60 - 1.60 ng/dL 0.82  Creatinine,U mg/dL 203.8  Microalb, Ur 0.0 - 1.9 mg/dL 1.8    ASSESSMENT / PLAN / RECOMMENDATIONS:   1) Type 1 Diabetes Mellitus, Poorly controlled, Without complications - Most recent A1c of 8.2  %. Goal A1c < 7.0 %.   - Declines CGM's- not a favourable experience for him  - In the past he has told me he uses a pump during the cool months, but he has not used it  for the past couple of years - I have advised him to look in to the InPen in the past  - The dates on his meter are incorrect  - He has self increased basel insulin and adjusted I: C ratio, he self described hypoglycemia after breakfast, will adjust his  I:C ratio for Breakfast  - BMP, lipid , MA/Cr and TFT's are normal  - DMV form filled today   MEDICATIONS: - Continue  Lantus 36 units daily - Humalog I: C ratio of 1: 12 for Breakfast and 1:8 for Lunch and Supper  - CF : Humalog ( BG-130/35)  EDUCATION / INSTRUCTIONS: BG monitoring instructions: Patient is instructed to check his blood sugars 4 times a day, before meals and bedtime  Call [place] [facility] clinic if: BG persistently < 70 I reviewed the Rule of 15 for the treatment of hypoglycemia in detail with the patient. Literature supplied.  2) Diabetic complications:  Eye: Does not   have known diabetic retinopathy.  Neuro/ Feet: Does not have known diabetic peripheral neuropathy .  Renal: Patient does not have known baseline CKD.   3) Lipids:  - Optimal , no indication for statin therapy   F/U in  6 months    Signed electronically by: Mack Guise, MD  [facility] Endocrinology  [facility] [address], [place] [address] Phone: [phone] FAX: [phone]   CC: Janith Lima, MD [place] [address] Phone: [phone]  Fax: [phone]  Return to Endocrinology clinic as below: No future appointments.

## 2021-11-27 NOTE — Patient Instructions (Addendum)
-   Continue  Lantus 36 units daily - Humalog 1 unit of Humalog for every 12 grams of carbohydrates for Breakfast and 1:8 for lunch and Supper  -Humalog correctional insulin: ADD extra units on insulin to your meal-time Humalog dose if your blood sugars are higher than 165. Use the scale below to help guide you:   Blood sugar before meal Number of units to inject  Less than 165 0 unit  166 -  200 1 units  201 -  235 2 units  236 -  270 3 units  271 -  305 4 units  306 -  340 5 units  341 - 375 6 units  376 -  410 7 units  411 -  445 8 units    HOW TO TREAT LOW BLOOD SUGARS (Blood sugar LESS THAN 70 MG/DL) Please follow the RULE OF 15 for the treatment of hypoglycemia treatment (when your (blood sugars are less than 70 mg/dL)   STEP 1: Take 15 grams of carbohydrates when your blood sugar is low, which includes:  3-4 GLUCOSE TABS  OR 3-4 OZ OF JUICE OR REGULAR SODA OR ONE TUBE OF GLUCOSE GEL    STEP 2: RECHECK blood sugar in 15 MINUTES STEP 3: If your blood sugar is still low at the 15 minute recheck --> then, go back to STEP 1 and treat AGAIN with another 15 grams of carbohydrates.

## 2022-01-25 ENCOUNTER — Other Ambulatory Visit: Payer: Self-pay | Admitting: Internal Medicine

## 2022-03-27 ENCOUNTER — Other Ambulatory Visit: Payer: Self-pay | Admitting: Internal Medicine

## 2022-05-28 ENCOUNTER — Ambulatory Visit: Payer: 59 | Admitting: Internal Medicine

## 2022-06-19 ENCOUNTER — Encounter: Payer: Self-pay | Admitting: Internal Medicine

## 2022-06-19 ENCOUNTER — Ambulatory Visit: Payer: 59 | Admitting: Internal Medicine

## 2022-06-19 NOTE — Progress Notes (Deleted)
Name: [name] [name].  Age/ Sex: 21 y.o., male   MRN/ DOB: [id number], November 24, 2001     PCP: Janith Lima, MD   Reason for Endocrinology Evaluation: Type 1 Diabetes Mellitus  Initial Endocrine Consultative Visit: 06/28/2020    PATIENT IDENTIFIER: [name]. is a 21 y.o. male with a past medical history of T1DM. The patient has followed with Endocrinology clinic since 06/28/2020 for consultative assistance with management of his diabetes.  DIABETIC HISTORY:  Mr. Cuda was diagnosed with DM in 2015.Was on the animas in the past, currently uses Medtronic 670 G during the seasons of fall, winter and spring , does not use it during the summer due to excessive sweating. Used to be on Dexcom G5 but did not have a good experience with it, has the guardian but does not use it.  His hemoglobin A1c has ranged from 6.6% in 2015, peaking at 8.4%  in .  He studies advance agriculture technology  SUBJECTIVE:   During the last visit (11/27/2021): A1c 8.2% we continued  MDI regimen      Today (06/19/2022): [name] is here for a follow up on diabetes management. He is accompanied by his mother.  He checks his blood sugars 2 -3 times daily. The patient has had hypoglycemic episodes since the last clinic visit, which typically occur between breakfast and lunch  . The patient is symptomatic with these episodes, lowest in the 50's    Denies nausea or vomiting or diarrhea     HOME DIABETES REGIMEN:  Lantus 36 units daily  Humalog 1:12 for breakfast, 1:8 for lunch and supper CF: Humalog ( BG -130/35)     METER DOWNLOAD SUMMARY: Unable to download  31 - $Re'424mg'WCJ$ /dL     DIABETIC COMPLICATIONS: Microvascular complications:   Denies: CKD, neuropathy, retinopathy Last Eye Exam: Completed 10/2019  Macrovascular complications:   Denies: CAD, CVA, PVD   HISTORY:  Past Medical History:  Past Medical History:  Diagnosis Date   Diabetes mellitus without complication ([place])     Fracture of wrist 2006   left   Medical history non-contributory    Past Surgical History:  Past Surgical History:  Procedure Laterality Date   ADENOIDECTOMY  2003   myringotomy Bilateral 21 years of age   Social History:  reports that he has never smoked. He has never used smokeless tobacco. He reports that he does not currently use alcohol. He reports that he does not use drugs. Family History:  Family History  Problem Relation Age of Onset   Diabetes Maternal Grandmother    GER disease Father    Diabetes Paternal Grandfather    Depression Paternal Grandfather    Alcoholism Paternal Grandfather    Diabetes Paternal Aunt      HOME MEDICATIONS: Allergies as of 06/19/2022   No Known Allergies      Medication List        Accurate as of June 19, 2022  7:08 AM. If you have any questions, ask your nurse or doctor.          Baqsimi One Pack 3 MG/DOSE Powd Generic drug: Glucagon Place 3 mg into the nose as directed.   BD Pen Needle Nano U/F 32G X 4 MM Misc Generic drug: Insulin Pen Needle INJECT INSULIN VIA INSULIN PEN 6 TIMES DAILY   Contour Next Link w/Device Kit 1 Device by Does not apply route in the morning, at noon, in the evening, and at bedtime.   Contour  Next Test test strip Generic drug: glucose blood 1 each by Other route in the morning, at noon, in the evening, and at bedtime. Use as instructed   HumaLOG KwikPen 200 UNIT/ML KwikPen Generic drug: insulin lispro Max daily 120 units   insulin glargine 100 UNIT/ML Solostar Pen Commonly known as: LANTUS Inject 32 Units into the skin daily at 10 pm.   triamcinolone cream 0.1 % Commonly known as: KENALOG Apply to the scaly areas on the leg.         OBJECTIVE:   Vital Signs: There were no vitals taken for this visit.  Wt Readings from Last 3 Encounters:  11/27/21 167 lb (75.8 kg)  03/27/21 164 lb 4 oz (74.5 kg)  10/03/20 159 lb (72.1 kg) (56 %, Z= 0.14)*   * Growth percentiles are based on CDC  (Boys, 2-20 Years) data.     Exam: General: Pt appears well and is in NAD  Neck: General: Supple without adenopathy. Thyroid: Thyroid size normal.  No goiter or nodules appreciated.  Lungs: Clear with good BS bilat   Heart: RRR   Abdomen: Normoactive bowel sounds, soft, nontender, without masses or organomegaly palpable  Extremities: No pretibial edema.   Neuro: MS is good with appropriate affect, pt is alert and Ox3     DM foot exam: 11/27/2021   The skin of the feet is intact without sores or ulcerations. The pedal pulses are 2+ on right and 2+ on left. The sensation is intact to a screening 5.07, 10 gram monofilament bilaterally     DATA REVIEWED:  Lab Results  Component Value Date   HGBA1C 8.2 (A) 11/27/2021   HGBA1C 8.8 (A) 03/27/2021   HGBA1C 8.2 (A) 10/03/2020     Latest Reference Range & Units 11/27/21 08:08  Sodium 135 - 145 mEq/L 138  Potassium 3.5 - 5.1 mEq/L 4.1  Chloride 96 - 112 mEq/L 103  CO2 19 - 32 mEq/L 28  Glucose 70 - 99 mg/dL 119 (H)  BUN 6 - 23 mg/dL 12  Creatinine 0.40 - 1.50 mg/dL 0.98  Calcium 8.4 - 10.5 mg/dL 9.1  GFR >60.00 mL/min 110.60  Total CHOL/HDL Ratio  2  Cholesterol 0 - 200 mg/dL 113  HDL Cholesterol >39.00 mg/dL 47.20  LDL (calc) 0 - 99 mg/dL 57  MICROALB/CREAT RATIO 0.0 - 30.0 mg/g 0.9  NonHDL  66.19  Triglycerides 0.0 - 149.0 mg/dL 44.0  VLDL 0.0 - 40.0 mg/dL 8.8  TSH 0.35 - 5.50 uIU/mL 1.58  T4,Free(Direct) 0.60 - 1.60 ng/dL 0.82  Creatinine,U mg/dL 203.8  Microalb, Ur 0.0 - 1.9 mg/dL 1.8    ASSESSMENT / PLAN / RECOMMENDATIONS:   1) Type 1 Diabetes Mellitus, Poorly controlled, Without complications - Most recent A1c of 8.2  %. Goal A1c < 7.0 %.   - Declines CGM's- not a favourable experience for him  - In the past he has told me he uses a pump during the cool months, but he has not used it for the past couple of years - I have advised him to look in to the InPen in the past  - The dates on his meter are incorrect   - He has self increased basel insulin and adjusted I: C ratio, he self described hypoglycemia after breakfast, will adjust his I:C ratio for Breakfast  - BMP, lipid , MA/Cr and TFT's are normal  - DMV form filled today   MEDICATIONS: - Continue  Lantus 36 units daily - Humalog I: C ratio  of 1: 12 for Breakfast and 1:8 for Lunch and Supper  - CF : Humalog ( BG-130/35)  EDUCATION / INSTRUCTIONS: BG monitoring instructions: Patient is instructed to check his blood sugars 4 times a day, before meals and bedtime  Call [facility] if: BG persistently < 70 I reviewed the Rule of 15 for the treatment of hypoglycemia in detail with the patient. Literature supplied.  2) Diabetic complications:  Eye: Does not   have known diabetic retinopathy.  Neuro/ Feet: Does not have known diabetic peripheral neuropathy .  Renal: Patient does not have known baseline CKD.   3) Lipids:  - Optimal , no indication for statin therapy   F/U in  6 months    Signed electronically by: Mack Guise, MD  [facility] End[facility]  [facility] [address] [place], [place] [address] Phone: [phone] FAX: [phone]   CC: Janith Lima, MD [place] [address] Phone: [phone]  Fax: [phone]  Return to Endocrinology clinic as below: Future Appointments  Date Time Provider [place]  06/19/2022  7:30 AM Jarika Robben, Melanie Crazier, MD [id number] None

## 2022-09-30 ENCOUNTER — Ambulatory Visit (INDEPENDENT_AMBULATORY_CARE_PROVIDER_SITE_OTHER): Payer: 59 | Admitting: Internal Medicine

## 2022-09-30 ENCOUNTER — Encounter: Payer: Self-pay | Admitting: Internal Medicine

## 2022-09-30 VITALS — BP 118/76 | HR 84 | Ht 73.0 in | Wt 170.4 lb

## 2022-09-30 DIAGNOSIS — E1065 Type 1 diabetes mellitus with hyperglycemia: Secondary | ICD-10-CM | POA: Insufficient documentation

## 2022-09-30 LAB — POCT GLUCOSE (DEVICE FOR HOME USE): POC Glucose: 170 mg/dl — AB (ref 70–99)

## 2022-09-30 LAB — POCT GLYCOSYLATED HEMOGLOBIN (HGB A1C): Hemoglobin A1C: 7.9 % — AB (ref 4.0–5.6)

## 2022-09-30 MED ORDER — INSULIN PEN NEEDLE 32G X 4 MM MISC: 1.0000 | Freq: Four times a day (QID) | 3 refills | Status: AC

## 2022-09-30 MED ORDER — ONETOUCH VERIO VI STRP: 1.0000 | ORAL_STRIP | Freq: Four times a day (QID) | 3 refills | Status: AC

## 2022-09-30 MED ORDER — DEXCOM G7 SENSOR MISC: 1.0000 | 3 refills | Status: AC

## 2022-09-30 MED ORDER — INSULIN GLARGINE 100 UNIT/ML SOLOSTAR PEN: 34.0000 [IU] | PEN_INJECTOR | Freq: Every day | SUBCUTANEOUS | 3 refills | Status: AC

## 2022-09-30 MED ORDER — HUMALOG KWIKPEN 200 UNIT/ML ~~LOC~~ SOPN: PEN_INJECTOR | SUBCUTANEOUS | 3 refills | Status: AC

## 2022-09-30 NOTE — Patient Instructions (Signed)
-   Decrease   Lantus 34 units daily - Humalog 1 unit of Humalog for every 12 grams of carbohydrates for Breakfast and 1:8 for lunch and Supper  -Humalog correctional insulin: ADD extra units on insulin to your meal-time Humalog dose if your blood sugars are higher than 165. Use the scale below to help guide you:   Blood sugar before meal Number of units to inject  Less than 165 0 unit  166 -  200 1 units  201 -  235 2 units  236 -  270 3 units  271 -  305 4 units  306 -  340 5 units  341 - 375 6 units  376 -  410 7 units  411 -  445 8 units    HOW TO TREAT LOW BLOOD SUGARS (Blood sugar LESS THAN 70 MG/DL) Please follow the RULE OF 15 for the treatment of hypoglycemia treatment (when your (blood sugars are less than 70 mg/dL)   STEP 1: Take 15 grams of carbohydrates when your blood sugar is low, which includes:  3-4 GLUCOSE TABS  OR 3-4 OZ OF JUICE OR REGULAR SODA OR ONE TUBE OF GLUCOSE GEL    STEP 2: RECHECK blood sugar in 15 MINUTES STEP 3: If your blood sugar is still low at the 15 minute recheck --> then, go back to STEP 1 and treat AGAIN with another 15 grams of carbohydrates.

## 2022-09-30 NOTE — Progress Notes (Signed)
Name: [name] [name].  Age/ Sex: 21 y.o., male   MRN/ DOB: [id number], 10-29-2001     PCP: Janith Lima, MD   Reason for Endocrinology Evaluation: Type 1 Diabetes Mellitus  Initial Endocrine Consultative Visit: 06/28/2020    PATIENT IDENTIFIER: [name]. is a 21 y.o. male with a past medical history of T1DM. The patient has followed with Endocrinology clinic since 06/28/2020 for consultative assistance with management of his diabetes.  DIABETIC HISTORY:  Mr. Clute was diagnosed with DM in 2015.Was on the animas in the past, currently uses Medtronic 670 G during the seasons of fall, winter and spring , does not use it during the summer due to excessive sweating. Used to be on Dexcom G5 but did not have a good experience with it, has the guardian but does not use it.  His hemoglobin A1c has ranged from 6.6% in 2015, peaking at 8.4%  in .  He studies advance agriculture technology  SUBJECTIVE:   During the last visit (11/27/2021): A1c 8.2%     Today (09/30/2022): [name] is here for a follow up on diabetes management. He checks his blood sugars 4 times daily. The patient has had hypoglycemic episodes since the last clinic visit, which typically occur between breakfast and lunch  . The patient is symptomatic with these episodes, lowest in the 50's    NO recent  nausea or vomiting or diarrhea except one episode a couple of weeks ago Denies tingling and numbness of the feet     HOME DIABETES REGIMEN:  Lantus 36 units daily  Humalog 1:12 with Breakfast, 1:8 for luch and Supper  CF: Humalog ( BG -130/35)    METER DOWNLOAD SUMMARY:   12-244   DIABETIC COMPLICATIONS: Microvascular complications:   Denies: CKD, neuropathy, retinopathy Last Eye Exam: Completed 2022  Macrovascular complications:   Denies: CAD, CVA, PVD   HISTORY:  Past Medical History:  Past Medical History:  Diagnosis Date   Diabetes mellitus without complication ([place])    Fracture of  wrist 2006   left   Medical history non-contributory    Past Surgical History:  Past Surgical History:  Procedure Laterality Date   ADENOIDECTOMY  2003   myringotomy Bilateral 21 years of age   Social History:  reports that he has never smoked. He has never used smokeless tobacco. He reports that he does not currently use alcohol. He reports that he does not use drugs. Family History:  Family History  Problem Relation Age of Onset   Diabetes Maternal Grandmother    GER disease Father    Diabetes Paternal Grandfather    Depression Paternal Grandfather    Alcoholism Paternal Grandfather    Diabetes Paternal Aunt      HOME MEDICATIONS: Allergies as of 09/30/2022   No Known Allergies      Medication List        Accurate as of September 30, 2022  3:08 PM. If you have any questions, ask your nurse or doctor.          Baqsimi One Pack 3 MG/DOSE Powd Generic drug: Glucagon Place 3 mg into the nose as directed.   BD Pen Needle Nano U/F 32G X 4 MM Misc Generic drug: Insulin Pen Needle INJECT INSULIN VIA INSULIN PEN 6 TIMES DAILY   Contour Next Link w/Device Kit 1 Device by Does not apply route in the morning, at noon, in the evening, and at bedtime.   Contour Next Test test  strip Generic drug: glucose blood 1 each by Other route in the morning, at noon, in the evening, and at bedtime. Use as instructed   HumaLOG KwikPen 200 UNIT/ML KwikPen Generic drug: insulin lispro Max daily 120 units   insulin glargine 100 UNIT/ML Solostar Pen Commonly known as: LANTUS Inject 32 Units into the skin daily at 10 pm. What changed: how much to take   triamcinolone cream 0.1 % Commonly known as: KENALOG Apply to the scaly areas on the leg.         OBJECTIVE:   Vital Signs: BP 118/76 (BP Location: Left Arm, Patient Position: Sitting, Cuff Size: Small)   Pulse 84   Ht _0  (1.854 m)   Wt 170 lb 6.4 oz (77.3 kg)   SpO2 99%   BMI 22.48 kg/m   Wt Readings from Last 3  Encounters:  09/30/22 170 lb 6.4 oz (77.3 kg)  11/27/21 167 lb (75.8 kg)  03/27/21 164 lb 4 oz (74.5 kg)     Exam: General: Pt appears well and is in NAD  Neck: General: Supple without adenopathy. Thyroid: Thyroid size normal.  No goiter or nodules appreciated.  Lungs: Clear with good BS bilat   Heart: RRR   Abdomen: Normoactive bowel sounds, soft, nontender, without masses or organomegaly palpable  Extremities: No pretibial edema.   Neuro: MS is good with appropriate affect, pt is alert and Ox3     DM foot exam: 09/30/2022   The skin of the feet is intact without sores or ulcerations. The pedal pulses are 2+ on right and 2+ on left. The sensation is intact to a screening 5.07, 10 gram monofilament bilaterally     DATA REVIEWED:  Lab Results  Component Value Date   HGBA1C 7.9 (A) 09/30/2022   HGBA1C 8.2 (A) 11/27/2021   HGBA1C 8.8 (A) 03/27/2021     Latest Reference Range & Units 11/27/21 08:08  Sodium 135 - 145 mEq/L 138  Potassium 3.5 - 5.1 mEq/L 4.1  Chloride 96 - 112 mEq/L 103  CO2 19 - 32 mEq/L 28  Glucose 70 - 99 mg/dL 119 (H)  BUN 6 - 23 mg/dL 12  Creatinine 0.40 - 1.50 mg/dL 0.98  Calcium 8.4 - 10.5 mg/dL 9.1  GFR >60.00 mL/min 110.60  Total CHOL/HDL Ratio  2  Cholesterol 0 - 200 mg/dL 113  HDL Cholesterol >39.00 mg/dL 47.20  LDL (calc) 0 - 99 mg/dL 57  MICROALB/CREAT RATIO 0.0 - 30.0 mg/g 0.9  NonHDL  66.19  Triglycerides 0.0 - 149.0 mg/dL 44.0  VLDL 0.0 - 40.0 mg/dL 8.8  TSH 0.35 - 5.50 uIU/mL 1.58  T4,Free(Direct) 0.60 - 1.60 ng/dL 0.82  Creatinine,U mg/dL 203.8  Microalb, Ur 0.0 - 1.9 mg/dL 1.8    ASSESSMENT / PLAN / RECOMMENDATIONS:   1) Type 1 Diabetes Mellitus, Sub-optimally  controlled, Without complications - Most recent A1c of 7.9  %. Goal A1c < 7.0 %.   - I am unable to verify his glucose readings on the Medtronic as its dated 2012, he was provided with dexcom G7 sample, a prescription was sent  to his mail pharmacy , he was also  provided with onetouch verio  meter and was asked not to use his old Medtronic meter  -Patient tells me he has been using less prandial insulin for supper due to tight/low BG's in the morning, I have advised the patient to reduce Lantus as below -I would not be making any changes to his correction scale number insulin  to carb ratio due to lack of glucose data -DMV form has been filled today    MEDICATIONS: -Decrease Lantus 34 units daily -Continue Humalog I: C ratio of 1: 12 for Breakfast and 1:8 for Lunch and Supper  -Continue CF : Humalog ( BG-130/35)  EDUCATION / INSTRUCTIONS: BG monitoring instructions: Patient is instructed to check his blood sugars 4 times a day, before meals and bedtime  Call [facility] clinic if: BG persistently < 70 I reviewed the Rule of 15 for the treatment of hypoglycemia in detail with the patient. Literature supplied.  2) Diabetic complications:  Eye: Does not   have known diabetic retinopathy.  Neuro/ Feet: Does not have known diabetic peripheral neuropathy .  Renal: Patient does not have known baseline CKD.     F/U in  6 months    Signed electronically by: Mack Guise, MD  [facility]  [facility] [address] [place], [place] [address] Phone: [phone] FAX: [phone]   CC: Janith Lima, MD [place] [address] Phone: [phone]  Fax: [phone]  Return to Endocrinology clinic as below: No future appointments.

## 2022-10-02 ENCOUNTER — Other Ambulatory Visit (HOSPITAL_COMMUNITY): Payer: Self-pay

## 2022-10-02 ENCOUNTER — Telehealth: Payer: Self-pay

## 2022-10-02 NOTE — Telephone Encounter (Signed)
Patient Advocate Encounter   Received notification that prior authorization for Dexcom G7 Sensor is required/requested.   PA submitted on 10/02/22 to [employer] via CoverMyMeds Key [id number]  Status is pending

## 2022-10-02 NOTE — Telephone Encounter (Signed)
Patient Advocate Encounter  Prior Authorization for [employer] has been approved.    PA# [id number] Key: [id number] Effective dates: 10/02/22 through 10/03/23

## 2023-04-03 ENCOUNTER — Ambulatory Visit: Payer: 59 | Admitting: Internal Medicine

## 2023-04-03 ENCOUNTER — Encounter: Payer: Self-pay | Admitting: Internal Medicine

## 2023-04-03 ENCOUNTER — Telehealth: Payer: Self-pay | Admitting: Internal Medicine

## 2023-04-03 NOTE — Telephone Encounter (Signed)
Patient dismissed from [facility] and all providers practicing at this clinic will no longer be able to provide medical care to you due to frequent missed appoints. 04/03/23

## 2023-04-03 NOTE — Progress Notes (Deleted)
Name: [name] [name].  Age/ Sex: 22 y.o., male   MRN/ DOB: [id number], [birth year]     PCP: Janith Lima, MD   Reason for Endocrinology Evaluation: Type 1 Diabetes Mellitus  Initial Endocrine Consultative Visit: 06/28/2020    PATIENT IDENTIFIER: [name] [name]. is a 22 y.o. male with a past medical history of T1DM. The patient has followed with Endocrinology clinic since 06/28/2020 for consultative assistance with management of his diabetes.  DIABETIC HISTORY:  [name] [name] was diagnosed with DM in 2015.Was on the animas in the past, currently uses Medtronic 670 G during the seasons of fall, winter and spring , does not use it during the summer due to excessive sweating. Used to be on Dexcom G5 but did not have a good experience with it, has the guardian but does not use it.  His hemoglobin A1c has ranged from 6.6% in 2015, peaking at 8.4%  in .  He studies advance agriculture technology  SUBJECTIVE:   During the last visit (10/31/2022): A1c 7.9%     Today (04/03/2023): [name] [name] is here for a follow up on diabetes management. He checks his blood sugars 4 times daily. The patient has had hypoglycemic episodes since the last clinic visit, which typically occur between breakfast and lunch  . The patient is symptomatic with these episodes.   No recent  nausea or vomiting or diarrhea Denies tingling and numbness of the feet     HOME DIABETES REGIMEN:  Lantus 34 units daily  Humalog 1:12 with Breakfast, 1:8 for lunch and Supper  CF: Humalog ( BG -130/35)    METER DOWNLOAD SUMMARY:   69-439 mg/dL    DIABETIC COMPLICATIONS: Microvascular complications:   Denies: CKD, neuropathy, retinopathy Last Eye Exam: Completed 2022  Macrovascular complications:   Denies: CAD, CVA, PVD   HISTORY:  Past Medical History:  Past Medical History:  Diagnosis Date   Diabetes mellitus without complication ([place])    Fracture of wrist 2006   left   Medical history non-contributory     Past Surgical History:  Past Surgical History:  Procedure Laterality Date   ADENOIDECTOMY  2003   myringotomy Bilateral 22 years of age   Social History:  reports that he has never smoked. He has never used smokeless tobacco. He reports that he does not currently use alcohol. He reports that he does not use drugs. Family History:  Family History  Problem Relation Age of Onset   Diabetes Maternal Grandmother    GER disease Father    Diabetes Paternal Grandfather    Depression Paternal Grandfather    Alcoholism Paternal Grandfather    Diabetes Paternal Aunt      HOME MEDICATIONS: Allergies as of 04/03/2023   No Known Allergies      Medication List        Accurate as of April 03, 2023  6:56 AM. If you have any questions, ask your nurse or doctor.          Baqsimi One Pack 3 MG/DOSE Powd Generic drug: Glucagon Place 3 mg into the nose as directed.   Contour Next Link w/Device Kit 1 Device by Does not apply route in the morning, at noon, in the evening, and at bedtime.   Dexcom G7 Sensor Misc 1 Device by Does not apply route as directed.   HumaLOG KwikPen 200 UNIT/ML KwikPen Generic drug: insulin lispro Max daily 120 units   insulin glargine 100 UNIT/ML Solostar Pen Commonly known as: LANTUS  Inject 34 Units into the skin daily.   Insulin Pen Needle 32G X 4 MM Misc 1 Device by Does not apply route in the morning, at noon, in the evening, and at bedtime.   OneTouch Verio test strip Generic drug: glucose blood 1 each by Other route in the morning, at noon, in the evening, and at bedtime. Use as instructed   triamcinolone cream 0.1 % Commonly known as: KENALOG Apply to the scaly areas on the leg.         OBJECTIVE:   Vital Signs: There were no vitals taken for this visit.  Wt Readings from Last 3 Encounters:  09/30/22 170 lb 6.4 oz (77.3 kg)  11/27/21 167 lb (75.8 kg)  03/27/21 164 lb 4 oz (74.5 kg)     Exam: General: Pt appears well and is in  NAD  Neck: General: Supple without adenopathy. Thyroid: Thyroid size normal.  No goiter or nodules appreciated.  Lungs: Clear with good BS bilat   Heart: RRR   Abdomen: Normoactive bowel sounds, soft, nontender, without masses or organomegaly palpable  Extremities: No pretibial edema.   Neuro: MS is good with appropriate affect, pt is alert and Ox3     DM foot exam: 09/30/2022   The skin of the feet is intact without sores or ulcerations. The pedal pulses are 2+ on right and 2+ on left. The sensation is intact to a screening 5.07, 10 gram monofilament bilaterally     DATA REVIEWED:  Lab Results  Component Value Date   HGBA1C 7.9 (A) 09/30/2022   HGBA1C 8.2 (A) 11/27/2021   HGBA1C 8.8 (A) 03/27/2021     Latest Reference Range & Units 11/27/21 08:08  Sodium 135 - 145 mEq/L 138  Potassium 3.5 - 5.1 mEq/L 4.1  Chloride 96 - 112 mEq/L 103  CO2 19 - 32 mEq/L 28  Glucose 70 - 99 mg/dL 119 (H)  BUN 6 - 23 mg/dL 12  Creatinine 0.40 - 1.50 mg/dL 0.98  Calcium 8.4 - 10.5 mg/dL 9.1  GFR >60.00 mL/min 110.60  Total CHOL/HDL Ratio  2  Cholesterol 0 - 200 mg/dL 113  HDL Cholesterol >39.00 mg/dL 47.20  LDL (calc) 0 - 99 mg/dL 57  MICROALB/CREAT RATIO 0.0 - 30.0 mg/g 0.9  NonHDL  66.19  Triglycerides 0.0 - 149.0 mg/dL 44.0  VLDL 0.0 - 40.0 mg/dL 8.8  TSH 0.35 - 5.50 uIU/mL 1.58  T4,Free(Direct) 0.60 - 1.60 ng/dL 0.82  Creatinine,U mg/dL 203.8  Microalb, Ur 0.0 - 1.9 mg/dL 1.8    ASSESSMENT / PLAN / RECOMMENDATIONS:   1) Type 1 Diabetes Mellitus, Sub-optimally  controlled, Without complications - Most recent A1c of 7.9  %. Goal A1c < 7.0 %.   - I am unable to verify his glucose readings on the Medtronic as its dated 2012, he was provided with dexcom G7 sample, a prescription was sent  to his mail pharmacy , he was also provided with onetouch verio  meter and was asked not to use his old Medtronic meter  -Patient tells me he has been using less prandial insulin for supper due to  tight/low BG's in the morning, I have advised the patient to reduce Lantus as below -I would not be making any changes to his correction scale number insulin to carb ratio due to lack of glucose data -DMV form has been filled today    MEDICATIONS: -Decrease Lantus 34 units daily -Continue Humalog I: C ratio of 1: 12 for Breakfast and 1:8 for  Lunch and Supper  -Continue CF : Humalog ( BG-130/35)  EDUCATION / INSTRUCTIONS: BG monitoring instructions: Patient is instructed to check his blood sugars 4 times a day, before meals and bedtime  Call [facility] if: BG persistently < 70 I reviewed the Rule of 15 for the treatment of hypoglycemia in detail with the patient. Literature supplied.  2) Diabetic complications:  Eye: Does not   have known diabetic retinopathy.  Neuro/ Feet: Does not have known diabetic peripheral neuropathy .  Renal: Patient does not have known baseline CKD.     F/U in  6 months    Signed electronically by: Mack Guise, MD  [facility]  [facility] [address] [place], [place] [address] Phone: [phone] FAX: [phone]   CC: Janith Lima, MD [place] [address] Phone: [phone]  Fax: [phone]  Return to Endocrinology clinic as below: Future Appointments  Date Time Provider [place]  04/03/2023  7:30 AM [place] Riggle, Melanie Crazier, MD [facility] None

## 2023-04-08 ENCOUNTER — Telehealth: Payer: Self-pay | Admitting: Internal Medicine

## 2023-04-08 NOTE — Telephone Encounter (Signed)
Pt has scheduled himself a NP appt(I will change to [facility]) with Lauren from Dr. Yetta Barre. His parents go to [employer] and they are very happy with her. He feels this would be a good fit. Let me know if this is OK and I will change to TOC.

## 2023-04-23 ENCOUNTER — Ambulatory Visit: Payer: 59 | Admitting: Nurse Practitioner

## 2023-04-23 ENCOUNTER — Encounter: Payer: Self-pay | Admitting: Nurse Practitioner

## 2023-04-23 VITALS — BP 122/80 | HR 82 | Temp 97.0°F | Ht 75.0 in | Wt 174.4 lb

## 2023-04-23 DIAGNOSIS — E109 Type 1 diabetes mellitus without complications: Secondary | ICD-10-CM | POA: Diagnosis not present

## 2023-04-23 NOTE — Progress Notes (Unsigned)
New Patient Visit  BP 122/80 (BP Location: Left Arm)   Pulse 82   Temp (!) 97 F (36.1 C)   Ht  (1.905 m)   Wt 174 lb 6.4 oz (79.1 kg)   SpO2 98%   BMI 21.80 kg/m    Subjective:    Patient ID: [name] [name]., male    DOB: 2001-01-26, 21 y.o.   MRN: [id number]  CC: Chief Complaint  Patient presents with   Establish Care    NP. Est. Care,request a referral to endocronologist    HPI: [name] [name]. is a 22 y.o. male presents for new patient visit to establish care.  Introduced to Publishing rights manager role and practice setting.  All questions answered.  Discussed provider/patient relationship and expectations.  He has a history of type 1 diabetes. He states that he will have occasional low blood sugars, but he will have a snack and it will come back up. He checks his blood sugars 4-5 times a day and sees around 200. He is currently taking lantus 38 units at bedtime and 12-14 units of humalog with meals. He was seeing endocrinology, however felt like she wasn't listening to him and he stopped seeing her. He would like a referral to a different endocrinologist. He denies chest pain, shortness of breath, and numbness and tingling in his feet.  Depression and Anxiety Screen done:     04/23/2023    3:32 PM 05/25/2020    9:14 AM  Depression screen PHQ 2/9  Decreased Interest 0 0  Down, Depressed, Hopeless 0 0  PHQ - 2 Score 0 0  Altered sleeping 1   Tired, decreased energy 0   Change in appetite 0   Feeling bad or failure about yourself  0   Trouble concentrating 0   Moving slowly or fidgety/restless 0   Suicidal thoughts 0   PHQ-9 Score 1   Difficult doing work/chores Not difficult at all       04/23/2023    3:33 PM  GAD 7 : Generalized Anxiety Score  Nervous, Anxious, on Edge 0  Control/stop worrying 0  Worry too much - different things 0  Trouble relaxing 0  Restless 1  Easily annoyed or irritable 1  Afraid - awful might happen 0  Total GAD 7 Score 2   Anxiety Difficulty Not difficult at all    Past Medical History:  Diagnosis Date   Diabetes mellitus without complication (HCC)    Fracture of wrist 12/30/2004   left    Past Surgical History:  Procedure Laterality Date   ADENOIDECTOMY  2003   myringotomy Bilateral 22 years of age    Family History  Problem Relation Age of Onset   Diabetes Maternal Grandmother    GER disease Father    Diabetes Paternal Grandfather    Depression Paternal Grandfather    Alcoholism Paternal Grandfather    Diabetes Paternal Aunt      Social History   Tobacco Use   Smoking status: Never   Smokeless tobacco: Never  Vaping Use   Vaping Use: Never used  Substance Use Topics   Alcohol use: Not Currently   Drug use: Never    Current Outpatient Medications on File Prior to Visit  Medication Sig Dispense Refill   Blood Glucose Monitoring Suppl (CONTOUR NEXT LINK) w/Device KIT 1 Device by Does not apply route in the morning, at noon, in the evening, and at bedtime. 1 kit 0   Continuous Blood  Gluc Sensor (DEXCOM G7 SENSOR) MISC 1 Device by Does not apply route as directed. 9 each 3   Glucagon (BAQSIMI ONE PACK) 3 MG/DOSE POWD Place 3 mg into the nose as directed. 3 each 1   glucose blood (ONETOUCH VERIO) test strip 1 each by Other route in the morning, at noon, in the evening, and at bedtime. Use as instructed 400 each 3   insulin glargine (LANTUS) 100 UNIT/ML Solostar Pen Inject 34 Units into the skin daily. 45 mL 3   insulin lispro (HUMALOG KWIKPEN) 200 UNIT/ML KwikPen Max daily 120 units 60 mL 3   Insulin Pen Needle 32G X 4 MM MISC 1 Device by Does not apply route in the morning, at noon, in the evening, and at bedtime. 400 each 3   triamcinolone cream (KENALOG) 0.1 % Apply to the scaly areas on the leg. (Patient not taking: Reported on 04/23/2023)     No current facility-administered medications on file prior to visit.     Review of Systems  Constitutional: Negative.   HENT: Negative.     Eyes: Negative.   Respiratory: Negative.    Cardiovascular: Negative.   Gastrointestinal: Negative.   Endocrine: Negative.   Genitourinary: Negative.   Musculoskeletal: Negative.   Skin: Negative.   Neurological: Negative.   Psychiatric/Behavioral: Negative.          Objective:    BP 122/80 (BP Location: Left Arm)   Pulse 82   Temp (!) 97 F (36.1 C)   Ht 6\' 3"  (1.905 m)   Wt 174 lb 6.4 oz (79.1 kg)   SpO2 98%   BMI 21.80 kg/m   Wt Readings from Last 3 Encounters:  04/23/23 174 lb 6.4 oz (79.1 kg)  09/30/22 170 lb 6.4 oz (77.3 kg)  11/27/21 167 lb (75.8 kg)    BP Readings from Last 3 Encounters:  04/23/23 122/80  09/30/22 118/76  11/27/21 124/80    Physical Exam Vitals and nursing note reviewed.  Constitutional:      General: He is not in acute distress.    Appearance: Normal appearance.  HENT:     Head: Normocephalic and atraumatic.     Right Ear: Tympanic membrane, ear canal and external ear normal.     Left Ear: Tympanic membrane, ear canal and external ear normal.  Eyes:     Conjunctiva/sclera: Conjunctivae normal.  Cardiovascular:     Rate and Rhythm: Normal rate and regular rhythm.     Pulses: Normal pulses.     Heart sounds: Normal heart sounds.  Pulmonary:     Effort: Pulmonary effort is normal.     Breath sounds: Normal breath sounds.  Abdominal:     Palpations: Abdomen is soft.     Tenderness: There is no abdominal tenderness.  Musculoskeletal:        General: Normal range of motion.     Cervical back: Normal range of motion and neck supple. No tenderness.     Right lower leg: No edema.     Left lower leg: No edema.  Lymphadenopathy:     Cervical: No cervical adenopathy.  Skin:    General: Skin is warm and dry.  Neurological:     General: No focal deficit present.     Mental Status: He is alert and oriented to person, place, and time.     Cranial Nerves: No cranial nerve deficit.     Coordination: Coordination normal.     Gait: Gait  normal.  Psychiatric:  Mood and Affect: Mood normal.        Behavior: Behavior normal.        Thought Content: Thought content normal.        Judgment: Judgment normal.        Assessment & Plan:   Problem List Items Addressed This Visit       Endocrine   Diabetes mellitus type I (HCC) - Primary    Chronic, ongoing. He declines labs today and wishes to get these done with endocrinology.  He also needs a referral to a new one as he was dismissed from his last endocrinologist for missing too many appointments.  Referral placed to endocrinology today.  He is currently checking his blood sugars and states they are in the 200s.  He is currently taking Lantus 38 units at bedtime and 12 to 14 units of Humalog with his meals.  Encouraged him to schedule an eye exam. He know signs and symptoms of hypoglycemia and how to treat.  He does not need any refills today.  Follow-up in 6 months for CPE.      Relevant Orders   Ambulatory referral to Endocrinology     Follow up plan: Return in about 6 months (around 10/23/2023) for CPE.

## 2023-04-23 NOTE — Patient Instructions (Signed)
It was great to see you!  I have placed a referral to endocrinology, they will call to schedule.   Let's follow-up in 6 months, sooner if you have concerns.  If a referral was placed today, you will be contacted for an appointment. Please note that routine referrals can sometimes take up to 3-4 weeks to process. Please call our office if you haven't heard anything after this time frame.  Take care,  Rodman Pickle, NP

## 2023-04-24 NOTE — Assessment & Plan Note (Signed)
Chronic, ongoing. He declines labs today and wishes to get these done with endocrinology.  He also needs a referral to a new one as he was dismissed from his last endocrinologist for missing too many appointments.  Referral placed to endocrinology today.  He is currently checking his blood sugars and states they are in the 200s.  He is currently taking Lantus 38 units at bedtime and 12 to 14 units of Humalog with his meals.  Encouraged him to schedule an eye exam. He know signs and symptoms of hypoglycemia and how to treat.  He does not need any refills today.  Follow-up in 6 months for CPE.

## 2023-10-20 ENCOUNTER — Encounter: Payer: Self-pay | Admitting: Nurse Practitioner

## 2023-11-10 ENCOUNTER — Other Ambulatory Visit: Payer: Self-pay | Admitting: Internal Medicine

## 2023-11-12 ENCOUNTER — Other Ambulatory Visit: Payer: Self-pay | Admitting: Internal Medicine
